# Patient Record
Sex: Male | Born: 1960 | Race: White | Hispanic: No | Marital: Married | Smoking: Former smoker
Health system: Southern US, Community
[De-identification: ages and names within clinical notes are randomized; demographics above are authoritative.]

## PROBLEM LIST (undated history)

## (undated) DIAGNOSIS — E119 Type 2 diabetes mellitus without complications: Secondary | ICD-10-CM

## (undated) DIAGNOSIS — E7800 Pure hypercholesterolemia, unspecified: Secondary | ICD-10-CM

## (undated) DIAGNOSIS — I10 Essential (primary) hypertension: Secondary | ICD-10-CM

## (undated) DIAGNOSIS — G4730 Sleep apnea, unspecified: Secondary | ICD-10-CM

## (undated) HISTORY — PX: APPENDECTOMY: SHX54

## (undated) HISTORY — PX: PALATE / UVULA BIOPSY / EXCISION: SUR128

## (undated) HISTORY — PX: LEG AMPUTATION BELOW KNEE: SHX694

---

## 2016-11-16 ENCOUNTER — Ambulatory Visit: Payer: Self-pay

## 2016-11-30 ENCOUNTER — Telehealth: Payer: Self-pay

## 2016-11-30 NOTE — Telephone Encounter (Signed)
SENT NOTES TO SCHEDULING 

## 2016-12-07 ENCOUNTER — Other Ambulatory Visit: Payer: Self-pay | Admitting: Nephrology

## 2016-12-07 DIAGNOSIS — N183 Chronic kidney disease, stage 3 unspecified: Secondary | ICD-10-CM

## 2016-12-12 ENCOUNTER — Other Ambulatory Visit: Payer: BLUE CROSS/BLUE SHIELD

## 2016-12-24 ENCOUNTER — Emergency Department (HOSPITAL_COMMUNITY)
Admission: EM | Admit: 2016-12-24 | Discharge: 2016-12-24 | Disposition: A | Discharge status: Home / Self Care | Payer: Medicare Other | Attending: Emergency Medicine | Admitting: Emergency Medicine

## 2016-12-24 ENCOUNTER — Encounter (HOSPITAL_COMMUNITY): Payer: Self-pay

## 2016-12-24 DIAGNOSIS — I10 Essential (primary) hypertension: Secondary | ICD-10-CM | POA: Insufficient documentation

## 2016-12-24 DIAGNOSIS — E119 Type 2 diabetes mellitus without complications: Secondary | ICD-10-CM | POA: Diagnosis not present

## 2016-12-24 DIAGNOSIS — Z79899 Other long term (current) drug therapy: Secondary | ICD-10-CM | POA: Insufficient documentation

## 2016-12-24 DIAGNOSIS — L03115 Cellulitis of right lower limb: Secondary | ICD-10-CM | POA: Diagnosis not present

## 2016-12-24 DIAGNOSIS — L538 Other specified erythematous conditions: Secondary | ICD-10-CM | POA: Diagnosis present

## 2016-12-24 HISTORY — DX: Type 2 diabetes mellitus without complications: E11.9

## 2016-12-24 HISTORY — DX: Essential (primary) hypertension: I10

## 2016-12-24 MED ORDER — DOXYCYCLINE HYCLATE 100 MG PO CAPS: 100.0000 mg | ORAL_CAPSULE | Freq: Two times a day (BID) | ORAL | 0 refills | Status: AC

## 2016-12-24 NOTE — Discharge Instructions (Signed)
Please read and follow all provided instructions.  Your diagnoses today include:  1. Cellulitis of right anterior lower leg    Tests performed today include: Vital signs. See below for your results today.   Medications prescribed:  Take as prescribed   Home care instructions:  Follow any educational materials contained in this packet.  Follow-up instructions: Please follow-up with your primary care provider for further evaluation of symptoms and treatment   Return instructions:  Please return to the Emergency Department if you do not get better, if you get worse, or new symptoms OR  - Fever (temperature greater than 101.27F)  - Bleeding that does not stop with holding pressure to the area    -Severe pain (please note that you may be more sore the day after your accident)  - Chest Pain  - Difficulty breathing  - Severe nausea or vomiting  - Inability to tolerate food and liquids  - Passing out  - Skin becoming red around your wounds  - Change in mental status (confusion or lethargy)  - New numbness or weakness    Please return if you have any other emergent concerns.  Additional Information:  Your vital signs today were: BP (!) 146/83    Pulse 61    Temp 98.2 F (36.8 C) (Oral)    Resp 14    SpO2 98%  If your blood pressure (BP) was elevated above 135/85 this visit, please have this repeated by your doctor within one month. ---------------

## 2016-12-24 NOTE — ED Triage Notes (Signed)
Pt states cut to R lower leg 3 days ago. Pt states worsening redness and warmth to area. Pt denies any drainage or bleeding. Pt ambulatory at triage.

## 2016-12-24 NOTE — ED Provider Notes (Signed)
[facility] DEPT Provider Note   CSN: [id number] Arrival date & time: 12/24/16  2129   By signing my name below, I, Eunice Blase, attest that this documentation has been prepared under the direction and in the presence of Shary Decamp, PA-C. Electronically Signed: Eunice Blase, Scribe. 12/24/16. 10:19 PM.   History   Chief Complaint Chief Complaint  Patient presents with  . Leg Injury   The history is provided by the patient, medical records and the spouse. No language interpreter was used.    HPI Comments: Isaiah Mejia is a 55 y.o. male who presents to the Emergency Department complaining of worsening lower anterior RLE pain after sustaining a mild  laceration to the area ~3 days ago. Pt and wife note associated progressive redness and warm to the area that began today. He states he treated the area with alcohol, abx ointment and bandage with adequate relief the first 2 days. Pt notes baseline thin skin on the shin, Hx of left leg amputation and Hx of DM. Pt denies fever and drainage.  Pt also expresses concern for a hangnail on the left little finger.  Past Medical History:  Diagnosis Date  . Diabetes mellitus without complication ([place])   . Hypertension     There are no active problems to display for this patient.   History reviewed. No pertinent surgical history.     Home Medications    Prior to Admission medications   Medication Sig Start Date End Date Taking? Authorizing Provider  doxycycline (VIBRAMYCIN) 100 MG capsule Take 1 capsule (100 mg total) by mouth 2 (two) times daily. 12/24/16 12/31/16  Shary Decamp, PA-C    Family History History reviewed. No pertinent family history.  Social History Social History  Substance Use Topics  . Smoking status: Never Smoker  . Smokeless tobacco: Never Used  . Alcohol use No     Allergies   Sulfa antibiotics   Review of Systems Review of Systems  Cardiovascular: Negative for leg swelling.  Musculoskeletal:  Negative for gait problem.  Skin: Positive for wound.  Allergic/Immunologic: Positive for immunocompromised state.     Physical Exam Updated Vital Signs BP (!) 146/83   Pulse 61   Temp 98.2 F (36.8 C) (Oral)   Resp 14   SpO2 98%   Physical Exam  Constitutional: He is oriented to person, place, and time. He appears well-developed and well-nourished.  HENT:  Head: Normocephalic and atraumatic.  Eyes: Conjunctivae are normal. Pupils are equal, round, and reactive to light.  Neck: Normal range of motion.  Pulmonary/Chest: Effort normal.  Neurological: He is alert and oriented to person, place, and time. Coordination normal.  Skin: Skin is warm and dry. There is erythema.  Wound with mild erythema; no purulence; no red streaking; no fluctuance/ induration palpable  Psychiatric: He has a normal mood and affect. His behavior is normal. Judgment and thought content normal.  Nursing note and vitals reviewed.      ED Treatments / Results  DIAGNOSTIC STUDIES: Oxygen Saturation is 98% on RA, normal by my interpretation.    COORDINATION OF CARE: 10:17 PM Discussed treatment plan with pt at bedside and pt agreed to plan. Will order medication. Pt advised of symptomatic care at home.  Labs (all labs ordered are listed, but only abnormal results are displayed) Labs Reviewed - No data to display  EKG  EKG Interpretation None       Radiology No results found.  Procedures Procedures (including critical care time)  Medications Ordered  in ED Medications - No data to display   Initial Impression / Assessment and Plan / ED Course  I have reviewed the triage vital signs and the nursing notes.  Pertinent labs & imaging results that were available during my care of the patient were reviewed by me and considered in my medical decision making (see chart for details).  Final Clinical Impressions(s) / ED Diagnoses     {I have reviewed the relevant previous healthcare records.  {I  obtained HPI from historian.   ED Course:  Assessment: Pt is a 56 y.o. male with hx DM who presents with right anterior lower leg cellulitis x3 days. No fevers. No purulence. No red streaking. Mild erythema. No induration or fluctuance palpable. On exam, pt in NAD. Nontoxic/nonseptic appearing. VSS. Afebrile. Given Rx Doxy and close follow up to PCP. Strict return precautions.. Plan is to DC home. At time of discharge, Patient is in no acute distress. Vital Signs are stable. Patient is able to ambulate. Patient able to tolerate PO.   Disposition/Plan:  DC Home Additional Verbal discharge instructions given and discussed with patient.  Pt Instructed to f/u with PCP in the next week for evaluation and treatment of symptoms. Return precautions given Pt acknowledges and agrees with plan  Supervising Physician Nat Christen, MD  Final diagnoses:  Cellulitis of right anterior lower leg    New Prescriptions New Prescriptions   DOXYCYCLINE (VIBRAMYCIN) 100 MG CAPSULE    Take 1 capsule (100 mg total) by mouth 2 (two) times daily.   I personally performed the services described in this documentation, which was scribed in my presence. The recorded information has been reviewed and is accurate.    Shary Decamp, PA-C 12/24/16 [place], MD 12/25/16 [phone]

## 2016-12-24 NOTE — ED Notes (Signed)
Pt stable, states understanding of discharge instructions 

## 2017-10-24 ENCOUNTER — Encounter (HOSPITAL_COMMUNITY): Payer: Self-pay | Admitting: Emergency Medicine

## 2017-10-24 ENCOUNTER — Emergency Department (HOSPITAL_COMMUNITY): Payer: Medicare Other

## 2017-10-24 ENCOUNTER — Inpatient Hospital Stay (HOSPITAL_COMMUNITY)
Admission: EM | Admit: 2017-10-24 | Discharge: 2017-10-29 | DRG: 464 | Disposition: A | Discharge status: Home Health | Payer: Medicare Other | Attending: Orthopedic Surgery | Admitting: Orthopedic Surgery

## 2017-10-24 DIAGNOSIS — Z6832 Body mass index (BMI) 32.0-32.9, adult: Secondary | ICD-10-CM

## 2017-10-24 DIAGNOSIS — Z79899 Other long term (current) drug therapy: Secondary | ICD-10-CM

## 2017-10-24 DIAGNOSIS — L03116 Cellulitis of left lower limb: Secondary | ICD-10-CM | POA: Diagnosis present

## 2017-10-24 DIAGNOSIS — E1142 Type 2 diabetes mellitus with diabetic polyneuropathy: Secondary | ICD-10-CM | POA: Diagnosis present

## 2017-10-24 DIAGNOSIS — N179 Acute kidney failure, unspecified: Secondary | ICD-10-CM | POA: Diagnosis present

## 2017-10-24 DIAGNOSIS — Z882 Allergy status to sulfonamides status: Secondary | ICD-10-CM | POA: Diagnosis not present

## 2017-10-24 DIAGNOSIS — G4730 Sleep apnea, unspecified: Secondary | ICD-10-CM | POA: Diagnosis not present

## 2017-10-24 DIAGNOSIS — Z7984 Long term (current) use of oral hypoglycemic drugs: Secondary | ICD-10-CM | POA: Diagnosis not present

## 2017-10-24 DIAGNOSIS — N183 Chronic kidney disease, stage 3 unspecified: Secondary | ICD-10-CM | POA: Diagnosis not present

## 2017-10-24 DIAGNOSIS — Y835 Amputation of limb(s) as the cause of abnormal reaction of the patient, or of later complication, without mention of misadventure at the time of the procedure: Secondary | ICD-10-CM | POA: Diagnosis present

## 2017-10-24 DIAGNOSIS — T8744 Infection of amputation stump, left lower extremity: Secondary | ICD-10-CM | POA: Diagnosis present

## 2017-10-24 DIAGNOSIS — E1169 Type 2 diabetes mellitus with other specified complication: Secondary | ICD-10-CM | POA: Diagnosis present

## 2017-10-24 DIAGNOSIS — E1122 Type 2 diabetes mellitus with diabetic chronic kidney disease: Secondary | ICD-10-CM | POA: Diagnosis present

## 2017-10-24 DIAGNOSIS — E785 Hyperlipidemia, unspecified: Secondary | ICD-10-CM | POA: Diagnosis not present

## 2017-10-24 DIAGNOSIS — L02416 Cutaneous abscess of left lower limb: Secondary | ICD-10-CM | POA: Diagnosis present

## 2017-10-24 DIAGNOSIS — E669 Obesity, unspecified: Secondary | ICD-10-CM | POA: Diagnosis present

## 2017-10-24 DIAGNOSIS — I10 Essential (primary) hypertension: Secondary | ICD-10-CM | POA: Diagnosis not present

## 2017-10-24 DIAGNOSIS — Z7982 Long term (current) use of aspirin: Secondary | ICD-10-CM | POA: Diagnosis not present

## 2017-10-24 DIAGNOSIS — B9561 Methicillin susceptible Staphylococcus aureus infection as the cause of diseases classified elsewhere: Secondary | ICD-10-CM | POA: Diagnosis present

## 2017-10-24 DIAGNOSIS — Z8619 Personal history of other infectious and parasitic diseases: Secondary | ICD-10-CM | POA: Diagnosis not present

## 2017-10-24 DIAGNOSIS — D696 Thrombocytopenia, unspecified: Secondary | ICD-10-CM | POA: Diagnosis not present

## 2017-10-24 DIAGNOSIS — I129 Hypertensive chronic kidney disease with stage 1 through stage 4 chronic kidney disease, or unspecified chronic kidney disease: Secondary | ICD-10-CM | POA: Diagnosis present

## 2017-10-24 DIAGNOSIS — G4733 Obstructive sleep apnea (adult) (pediatric): Secondary | ICD-10-CM | POA: Diagnosis present

## 2017-10-24 DIAGNOSIS — Z89512 Acquired absence of left leg below knee: Secondary | ICD-10-CM | POA: Diagnosis not present

## 2017-10-24 DIAGNOSIS — L0291 Cutaneous abscess, unspecified: Secondary | ICD-10-CM | POA: Diagnosis present

## 2017-10-24 DIAGNOSIS — D62 Acute posthemorrhagic anemia: Secondary | ICD-10-CM | POA: Diagnosis not present

## 2017-10-24 DIAGNOSIS — E7800 Pure hypercholesterolemia, unspecified: Secondary | ICD-10-CM | POA: Diagnosis present

## 2017-10-24 HISTORY — DX: Pure hypercholesterolemia, unspecified: E78.00

## 2017-10-24 HISTORY — DX: Sleep apnea, unspecified: G47.30

## 2017-10-24 LAB — CBC
HCT: 38.4 % — ABNORMAL LOW (ref 39.0–52.0)
HEMOGLOBIN: 13.4 g/dL (ref 13.0–17.0)
MCH: 31.5 pg (ref 26.0–34.0)
MCHC: 34.9 g/dL (ref 30.0–36.0)
MCV: 90.4 fL (ref 78.0–100.0)
PLATELETS: 168 10*3/uL (ref 150–400)
RBC: 4.25 MIL/uL (ref 4.22–5.81)
RDW: 13 % (ref 11.5–15.5)
WBC: 10.6 10*3/uL — AB (ref 4.0–10.5)

## 2017-10-24 LAB — BASIC METABOLIC PANEL
Anion gap: 11 (ref 5–15)
BUN: 33 mg/dL — AB (ref 6–20)
CO2: 21 mmol/L — ABNORMAL LOW (ref 22–32)
Calcium: 9.3 mg/dL (ref 8.9–10.3)
Chloride: 107 mmol/L (ref 101–111)
Creatinine, Ser: 1.79 mg/dL — ABNORMAL HIGH (ref 0.61–1.24)
GFR, EST AFRICAN AMERICAN: 46 mL/min — AB (ref >60)
GFR, EST NON AFRICAN AMERICAN: 40 mL/min — AB (ref >60)
Glucose, Bld: 131 mg/dL — ABNORMAL HIGH (ref 65–99)
Potassium: 4.6 mmol/L (ref 3.5–5.1)
SODIUM: 139 mmol/L (ref 135–145)

## 2017-10-24 LAB — CBG MONITORING, ED: GLUCOSE-CAPILLARY: 161 mg/dL — AB (ref 65–99)

## 2017-10-24 LAB — SEDIMENTATION RATE: Sed Rate: 25 mm/hr — ABNORMAL HIGH (ref 0–16)

## 2017-10-24 LAB — C-REACTIVE PROTEIN: CRP: <0.8 mg/dL (ref <1.0)

## 2017-10-24 LAB — GLUCOSE, CAPILLARY: Glucose-Capillary: 169 mg/dL — ABNORMAL HIGH (ref 65–99)

## 2017-10-24 LAB — I-STAT CG4 LACTIC ACID, ED: LACTIC ACID, VENOUS: 0.7 mmol/L (ref 0.5–1.9)

## 2017-10-24 MED ORDER — ONDANSETRON HCL 4 MG PO TABS: 4.0000 mg | ORAL_TABLET | Freq: Four times a day (QID) | ORAL | Status: DC | PRN

## 2017-10-24 MED ORDER — OXYCODONE HCL 5 MG PO TABS
5.0000 mg | ORAL_TABLET | ORAL | Status: DC | PRN
  Administered 2017-10-24 – 2017-10-25 (×3): 15 mg via ORAL
  Filled 2017-10-24 (×4): qty 3

## 2017-10-24 MED ORDER — ACETAMINOPHEN 650 MG RE SUPP: 650.0000 mg | Freq: Four times a day (QID) | RECTAL | Status: DC | PRN

## 2017-10-24 MED ORDER — VANCOMYCIN HCL 10 G IV SOLR
2000.0000 mg | Freq: Once | INTRAVENOUS | Status: AC
  Administered 2017-10-24: 2000 mg via INTRAVENOUS
  Filled 2017-10-24 (×2): qty 2000

## 2017-10-24 MED ORDER — OXYCODONE-ACETAMINOPHEN 5-325 MG PO TABS
2.0000 | ORAL_TABLET | Freq: Once | ORAL | Status: AC
  Administered 2017-10-24: 2 via ORAL
  Filled 2017-10-24: qty 2

## 2017-10-24 MED ORDER — DOXYCYCLINE HYCLATE 100 MG PO TABS
100.0000 mg | ORAL_TABLET | Freq: Once | ORAL | Status: AC
  Administered 2017-10-24: 100 mg via ORAL
  Filled 2017-10-24: qty 1

## 2017-10-24 MED ORDER — MORPHINE SULFATE (PF) 4 MG/ML IV SOLN
4.0000 mg | Freq: Once | INTRAVENOUS | Status: DC
  Filled 2017-10-24: qty 1

## 2017-10-24 MED ORDER — INSULIN ASPART 100 UNIT/ML ~~LOC~~ SOLN
0.0000 [IU] | Freq: Three times a day (TID) | SUBCUTANEOUS | Status: DC
  Administered 2017-10-25: 2 [IU] via SUBCUTANEOUS
  Administered 2017-10-25: 5 [IU] via SUBCUTANEOUS
  Administered 2017-10-25 – 2017-10-26 (×3): 3 [IU] via SUBCUTANEOUS

## 2017-10-24 MED ORDER — LORAZEPAM 2 MG/ML IJ SOLN
1.0000 mg | Freq: Once | INTRAMUSCULAR | Status: AC
  Administered 2017-10-24: 1 mg via INTRAVENOUS
  Filled 2017-10-24: qty 1

## 2017-10-24 MED ORDER — ACETAMINOPHEN 325 MG PO TABS: 650.0000 mg | ORAL_TABLET | Freq: Four times a day (QID) | ORAL | Status: DC | PRN

## 2017-10-24 MED ORDER — MORPHINE SULFATE (PF) 4 MG/ML IV SOLN
2.0000 mg | INTRAVENOUS | Status: DC | PRN
  Filled 2017-10-24: qty 1

## 2017-10-24 MED ORDER — PIPERACILLIN-TAZOBACTAM 3.375 G IVPB
3.3750 g | Freq: Three times a day (TID) | INTRAVENOUS | Status: DC
  Administered 2017-10-24 – 2017-10-26 (×5): 3.375 g via INTRAVENOUS
  Filled 2017-10-24 (×7): qty 50

## 2017-10-24 MED ORDER — PIPERACILLIN-TAZOBACTAM 3.375 G IVPB 30 MIN
3.3750 g | Freq: Once | INTRAVENOUS | Status: AC
  Administered 2017-10-24: 3.375 g via INTRAVENOUS
  Filled 2017-10-24: qty 50

## 2017-10-24 MED ORDER — ONDANSETRON HCL 4 MG/2ML IJ SOLN
4.0000 mg | Freq: Four times a day (QID) | INTRAMUSCULAR | Status: DC | PRN
  Administered 2017-10-26 (×2): 4 mg via INTRAVENOUS

## 2017-10-24 MED ORDER — GADOBENATE DIMEGLUMINE 529 MG/ML IV SOLN
20.0000 mL | Freq: Once | INTRAVENOUS | Status: AC | PRN
  Administered 2017-10-24: 20 mL via INTRAVENOUS

## 2017-10-24 MED ORDER — VANCOMYCIN HCL 10 G IV SOLR
1250.0000 mg | INTRAVENOUS | Status: DC
  Administered 2017-10-25: 1250 mg via INTRAVENOUS
  Filled 2017-10-24 (×2): qty 1250

## 2017-10-24 MED ORDER — ENOXAPARIN SODIUM 40 MG/0.4ML ~~LOC~~ SOLN
40.0000 mg | SUBCUTANEOUS | Status: DC
  Administered 2017-10-24: 40 mg via SUBCUTANEOUS
  Filled 2017-10-24: qty 0.4

## 2017-10-24 NOTE — ED Provider Notes (Signed)
[facility] Provider Note   CSN: [id number] Arrival date & time: 10/24/17  0530     History   Chief Complaint Chief Complaint  Patient presents with  . Leg Pain    HPI Isaiah Mejia is a 57 y.o. male.  HPI 57 year old male with a history of left below-the-knee amputation 2 years ago in the ER presents the emergency department with complaints of small erythema of the lateral aspect of his distal stump with some new pain drainage which began today.  Concern for possible infection and thus came to the ER for further evaluation.  He has been seen once by the team at [facility].  He denies fevers and chills.  He does report wearing a new prosthesis.  He feels like there may be some irritation on the lateral aspect.  No other complaints.   Past Medical History:  Diagnosis Date  . Diabetes mellitus without complication ([place])   . Hypertension     Patient Active Problem List   Diagnosis Date Noted  . Acute abscess 10/24/2017    Past Surgical History:  Procedure Laterality Date  . APPENDECTOMY    . LEG AMPUTATION BELOW KNEE Left   . PALATE / UVULA BIOPSY / EXCISION         Home Medications    Prior to Admission medications   Medication Sig Start Date End Date Taking? Authorizing Provider  amLODipine (NORVASC) 10 MG tablet Take 10 mg by mouth daily.   Yes [provider]  aspirin 325 MG tablet Take 325 mg by mouth daily.   Yes [provider]  gabapentin (NEURONTIN) 300 MG capsule Take 300 mg by mouth 3 (three) times daily. 10/23/17  Yes [provider]  glipiZIDE (GLUCOTROL XL) 10 MG 24 hr tablet Take 1 tablet by mouth daily. 09/05/17  Yes [provider]  lisinopril (PRINIVIL,ZESTRIL) 10 MG tablet Take 10 mg by mouth daily.   Yes [provider]  Multiple Vitamins-Minerals (MULTIVITAMIN WITH MINERALS) tablet Take 1 tablet by mouth daily.   Yes [provider]  simvastatin (ZOCOR) 40 MG  tablet Take 40 mg by mouth at bedtime. 10/23/17  Yes [provider]  vitamin C (ASCORBIC ACID) 500 MG tablet Take 500 mg by mouth daily.   Yes [provider]    Family History History reviewed. No pertinent family history.  Social History Social History   Tobacco Use  . Smoking status: Never Smoker  . Smokeless tobacco: Never Used  Substance Use Topics  . Alcohol use: No  . Drug use: No     Allergies   Sulfa antibiotics   Review of Systems Review of Systems  All other systems reviewed and are negative.    Physical Exam Updated Vital Signs BP (!) 164/70 (BP Location: Left Arm)   Pulse 83   Temp 100 F (37.8 C) (Oral)   Resp 18   Ht 6\' 2"  (1.88 m)   Wt 110.7 kg (244 lb)   SpO2 96%   BMI 31.33 kg/m   Physical Exam  Constitutional: He is oriented to person, place, and time. He appears well-developed and well-nourished.  HENT:  Head: Normocephalic.  Eyes: EOM are normal.  Neck: Normal range of motion.  Pulmonary/Chest: Effort normal.  Abdominal: He exhibits no distension.  Musculoskeletal: Normal range of motion.  Left BKA with mild erythema at the lateral aspect with serous non-foul smelling drainage from the area.  Some generalized tenderness to this area without gross  fluctuance or significant spreading erythema or warmth.  Neurological: He is alert and oriented to person, place, and time.  Psychiatric: He has a normal mood and affect.  Nursing note and vitals reviewed.    ED Treatments / Results  Labs (all labs ordered are listed, but only abnormal results are displayed) Labs Reviewed  CBC - Abnormal; Notable for the following components:      Result Value   WBC 10.6 (*)    HCT 38.4 (*)    All other components within normal limits  BASIC METABOLIC PANEL - Abnormal; Notable for the following components:   CO2 21 (*)    Glucose, Bld 131 (*)    BUN 33 (*)    Creatinine, Ser 1.79 (*)    GFR calc non Af Amer 40 (*)    GFR calc Af  Amer 46 (*)    All other components within normal limits  SEDIMENTATION RATE - Abnormal; Notable for the following components:   Sed Rate 25 (*)    All other components within normal limits  GLUCOSE, CAPILLARY - Abnormal; Notable for the following components:   Glucose-Capillary 169 (*)    All other components within normal limits  CBG MONITORING, ED - Abnormal; Notable for the following components:   Glucose-Capillary 161 (*)    All other components within normal limits  C-REACTIVE PROTEIN  HIV ANTIBODY (ROUTINE TESTING)  I-STAT CG4 LACTIC ACID, ED    EKG  EKG Interpretation None       Radiology Dg Tibia/fibula Left  Result Date: 10/24/2017 CLINICAL DATA:  Cutaneous wound over the left BKA stump with serous drainage this morning and mild redness and touch tenderness. BKA was performed in 2015. EXAM: LEFT TIBIA AND FIBULA - 2 VIEW COMPARISON:  None in PACs FINDINGS: There is soft tissue swelling over the stump with small amounts of soft tissue gas. There are calcifications adjacent to the tibial stump margin but no objective changes of osteomyelitis. There is irregularity of the tip of the fibular stump which could reflect osteomyelitis. Without previous studies it is difficult to judge if the contour of the fibular tip has changed. There are vascular calcifications. IMPRESSION: Cellulitis of the stump of the left BKA. Possible osteomyelitis of the tip of the fibular remnant. Electronically Signed   By: David  Martinique M.D.   On: 10/24/2017 08:25   Mr Tibia Fibula Left W Wo Contrast  Result Date: 10/24/2017 CLINICAL DATA:  History of left BKA with new drainage from the stump wound. Evaluate for osteomyelitis. EXAM: MRI OF LOWER LEFT EXTREMITY WITHOUT AND WITH CONTRAST TECHNIQUE: Multiplanar, multisequence MR imaging of the left tibia and fibula was performed both before and after administration of intravenous contrast. CONTRAST:  80mL MULTIHANCE GADOBENATE DIMEGLUMINE 529 MG/ML IV SOLN  COMPARISON:  Left tibia and fibula x-rays from same day. FINDINGS: Bones/Joint/Cartilage Prior left below-knee amputation. No marrow signal abnormality. No fracture or dislocation. Normal alignment. Moderate right and small left knee joint effusions. Moderate degenerative changes of the right knee. Mild degenerative changes of the left knee. Moderate right Baker cyst. Ligaments Knee ligaments are grossly intact. Tears of both medial menisci, incompletely evaluated. Muscles and Tendons Mild fatty atrophy of the proximal left lower leg muscles. Soft tissue Prominent soft tissue edema and enhancement along the anterior and lateral aspect of the BKA stump. A few foci of T1 and T2 hypointense signal within the soft tissues likely represents air. There is an ill-defined, irregular, rim enhancing fluid collection in the lateral  subcutaneous tissues of the distal stump which appears to extend to the skin surface. This measures approximately 5.3 x 3.0 x 11.0 cm (AP by transverse by CC). IMPRESSION: 1. Cellulitis of the distal left BKA stump with 5.3 x 3.0 x 11.0 cm ill-defined, irregular, rim enhancing fluid collection in the lateral subcutaneous tissues of the distal stump, concerning for abscess. 2. No evidence of osteomyelitis. 3. Moderate right and small left knee joint effusions. 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 4. Degenerative tearing of both medial menisci, incompletely evaluated. Electronically Signed   By: Titus Dubin M.D.   On: 10/24/2017 14:16    Procedures Procedures (including critical care time)  Medications Ordered in ED Medications  acetaminophen (TYLENOL) tablet 650 mg (not administered)    Or  acetaminophen (TYLENOL) suppository 650 mg (not administered)  enoxaparin (LOVENOX) injection 40 mg (40 mg Subcutaneous Given 10/24/17 2201)  oxyCODONE (Oxy IR/ROXICODONE) immediate release tablet 5-15 mg (15 mg Oral Given 10/24/17 2151)  morphine 4 MG/ML injection 2 mg (not  administered)  ondansetron (ZOFRAN) tablet 4 mg (not administered)    Or  ondansetron (ZOFRAN) injection 4 mg (not administered)  piperacillin-tazobactam (ZOSYN) IVPB 3.375 g (3.375 g Intravenous New Bag/Given 10/24/17 2153)  insulin aspart (novoLOG) injection 0-15 Units (3 Units Subcutaneous Not Given 10/24/17 1823)  vancomycin (VANCOCIN) 1,250 mg in sodium chloride 0.9 % 250 mL IVPB (not administered)  doxycycline (VIBRA-TABS) tablet 100 mg (100 mg Oral Given 10/24/17 0753)  oxyCODONE-acetaminophen (PERCOCET/ROXICET) 5-325 MG per tablet 2 tablet (2 tablets Oral Given 10/24/17 1034)  LORazepam (ATIVAN) injection 1 mg (1 mg Intravenous Given 10/24/17 1229)  gadobenate dimeglumine (MULTIHANCE) injection 20 mL (20 mLs Intravenous Contrast Given 10/24/17 1340)  oxyCODONE-acetaminophen (PERCOCET/ROXICET) 5-325 MG per tablet 2 tablet (2 tablets Oral Given 10/24/17 1438)  piperacillin-tazobactam (ZOSYN) IVPB 3.375 g (0 g Intravenous Stopped 10/24/17 1735)  vancomycin (VANCOCIN) 2,000 mg in sodium chloride 0.9 % 500 mL IVPB (2,000 mg Intravenous New Bag/Given 10/24/17 1824)     Initial Impression / Assessment and Plan / ED Course  I have reviewed the triage vital signs and the nursing notes.  Pertinent labs & imaging results that were available during my care of the patient were reviewed by me and considered in my medical decision making (see chart for details).     I personally evaluate the images.  Questionable osteomyelitis noted on plain films.  Orthopedic consultation and recommendation for MRI which demonstrated a large distal soft tissue density concerning for abscess.  Patient will be admitted for operative drainage of the abscess tomorrow.  IV antibiotics now.  Pain control.  Final Clinical Impressions(s) / ED Diagnoses   Final diagnoses:  None    ED Discharge Orders    None       Jola Schmidt, MD 10/24/17 2313

## 2017-10-24 NOTE — Progress Notes (Signed)
Pharmacy Antibiotic Note  Isaiah Mejia is a 57 y.o. male admitted on 10/24/2017 with left BKA stump infection.  Pharmacy has been consulted for vancomycin dosing. Renal insufficiency noted with sCr 1.79 and CrCl ~ 60 ml/min.  Vancomycin trough goal 10-15  Plan: 1) Vancomycin 2g IV x 1 then 1250mg  IV q24 2) Zosyn 3.375g IV q8 (4 hour infusion) per MD 3) Follow renal function, cultures, LOT, level if needed  Height: 6\' 2"  (188 cm) Weight: 245 lb (111.1 kg) IBW/kg (Calculated) : 82.2  Temp (24hrs), Avg:98.4 F (36.9 C), Min:98.2 F (36.8 C), Max:98.7 F (37.1 C)  Recent Labs  Lab 10/24/17 0547 10/24/17 0603  WBC 10.6*  --   CREATININE 1.79*  --   LATICACIDVEN  --  0.70    Estimated Creatinine Clearance: 59.7 mL/min (A) (by C-G formula based on SCr of 1.79 mg/dL (H)).    Allergies  Allergen Reactions  . Sulfa Antibiotics     Throat swelling    Thank you for allowing pharmacy to be a part of this patient's care.  Deboraha Sprang 10/24/2017 4:04 PM

## 2017-10-24 NOTE — ED Triage Notes (Signed)
Pt reports pain in L bka starting this morning upon waking. Some drainage from a small pinpoint area on base of amputation site.

## 2017-10-24 NOTE — Consult Note (Addendum)
Reason for Consult:Left BKA drainage Referring Physician: K Shahzaib [name] is an 57 y.o. male.  HPI: Isaiah Mejia was awoken this morning by pain in his stump. He was also having some clearish discharge from it. He has had occasional openings before but never any drainage. He denies any fevers, chills, sweats, N/V.   Past Medical History:  Diagnosis Date  . Diabetes mellitus without complication ([place])   . Hypertension     Past Surgical History:  Procedure Laterality Date  . APPENDECTOMY    . LEG AMPUTATION BELOW KNEE Left   . PALATE / UVULA BIOPSY / EXCISION      History reviewed. No pertinent family history.  Social History:  reports that  has never smoked. he has never used smokeless tobacco. He reports that he does not drink alcohol or use drugs.  Allergies:  Allergies  Allergen Reactions  . Sulfa Antibiotics     Throat swelling    Medications: I have reviewed the patient's current medications.  Results for orders placed or performed during the hospital encounter of 10/24/17 (from the past 48 hour(s))  CBC     Status: Abnormal   Collection Time: 10/24/17  5:47 AM  Result Value Ref Range   WBC 10.6 (H) 4.0 - 10.5 K/uL   RBC 4.25 4.22 - 5.81 MIL/uL   Hemoglobin 13.4 13.0 - 17.0 g/dL   HCT 38.4 (L) 39.0 - 52.0 %   MCV 90.4 78.0 - 100.0 fL   MCH 31.5 26.0 - 34.0 pg   MCHC 34.9 30.0 - 36.0 g/dL   RDW 13.0 11.5 - 15.5 %   Platelets 168 150 - 400 K/uL  Basic metabolic panel     Status: Abnormal   Collection Time: 10/24/17  5:47 AM  Result Value Ref Range   Sodium 139 135 - 145 mmol/L   Potassium 4.6 3.5 - 5.1 mmol/L   Chloride 107 101 - 111 mmol/L   CO2 21 (L) 22 - 32 mmol/L   Glucose, Bld 131 (H) 65 - 99 mg/dL   BUN 33 (H) 6 - 20 mg/dL   Creatinine, Ser 1.79 (H) 0.61 - 1.24 mg/dL   Calcium 9.3 8.9 - 10.3 mg/dL   GFR calc non Af Amer 40 (L) >60 mL/min   GFR calc Af Amer 46 (L) >60 mL/min    Comment: (NOTE) The eGFR has been calculated using the CKD EPI  equation. This calculation has not been validated in all clinical situations. eGFR's persistently <60 mL/min signify possible Chronic Kidney Disease.    Anion gap 11 5 - 15  I-Stat CG4 Lactic Acid, ED     Status: None   Collection Time: 10/24/17  6:03 AM  Result Value Ref Range   Lactic Acid, Venous 0.70 0.5 - 1.9 mmol/L    Dg Tibia/fibula Left  Result Date: 10/24/2017 CLINICAL DATA:  Cutaneous wound over the left BKA stump with serous drainage this morning and mild redness and touch tenderness. BKA was performed in 2015. EXAM: LEFT TIBIA AND FIBULA - 2 VIEW COMPARISON:  None in PACs FINDINGS: There is soft tissue swelling over the stump with small amounts of soft tissue gas. There are calcifications adjacent to the tibial stump margin but no objective changes of osteomyelitis. There is irregularity of the tip of the fibular stump which could reflect osteomyelitis. Without previous studies it is difficult to judge if the contour of the fibular tip has changed. There are vascular calcifications. IMPRESSION: Cellulitis of the stump of the  left BKA. Possible osteomyelitis of the tip of the fibular remnant. Electronically Signed   By: David  Martinique M.D.   On: 10/24/2017 08:25    Review of Systems  Constitutional: Negative for weight loss.  HENT: Negative for ear discharge, ear pain, hearing loss and tinnitus.   Eyes: Negative for blurred vision, double vision, photophobia and pain.  Respiratory: Negative for cough, sputum production and shortness of breath.   Cardiovascular: Negative for chest pain.  Gastrointestinal: Negative for abdominal pain, nausea and vomiting.  Genitourinary: Negative for dysuria, flank pain, frequency and urgency.  Musculoskeletal: Positive for joint pain (Left BKA stump). Negative for back pain, falls, myalgias and neck pain.  Neurological: Negative for dizziness, tingling, sensory change, focal weakness, loss of consciousness and headaches.  Endo/Heme/Allergies: Does  not bruise/bleed easily.  Psychiatric/Behavioral: Negative for depression, memory loss and substance abuse. The patient is not nervous/anxious.    Blood pressure (!) 155/84, pulse 63, temperature 98.4 F (36.9 C), temperature source Oral, resp. rate 18, height 6' 2"  (1.88 m), weight 111.1 kg (245 lb), SpO2 96 %. Physical Exam  Constitutional: He appears well-developed and well-nourished. No distress.  HENT:  Head: Normocephalic and atraumatic.  Eyes: Conjunctivae are normal. Right eye exhibits no discharge. Left eye exhibits no discharge. No scleral icterus.  Neck: Normal range of motion.  Cardiovascular: Normal rate and regular rhythm.  Respiratory: Effort normal. No respiratory distress.  Musculoskeletal:  LLE No traumatic wounds, ecchymosis, or rash  Distal BKA stump moderate TTP, small fistula with mild discharge serous fluid, small patch erythema   Neurological: He is alert.  Skin: Skin is warm and dry. He is not diaphoretic.  Psychiatric: He has a normal mood and affect. His behavior is normal.    Assessment/Plan: S/p left BKA w/cellulitis, seroma -- Would recommend course of oral abx and outpatient follow-up with Dr. Doran Durand. Will obtain MRI prior to discharge.    Isaiah Abu, PA-C Orthopedic Surgery [phone] 10/24/2017, 9:54 AM   MRI shows an abscess of the left leg.  No osteo.  We'll admit him for IV abx and surgical debridement.

## 2017-10-24 NOTE — ED Notes (Signed)
ED Provider at bedside. 

## 2017-10-25 ENCOUNTER — Other Ambulatory Visit: Payer: Self-pay

## 2017-10-25 ENCOUNTER — Encounter (HOSPITAL_BASED_OUTPATIENT_CLINIC_OR_DEPARTMENT_OTHER): Payer: Self-pay | Admitting: *Deleted

## 2017-10-25 LAB — GLUCOSE, CAPILLARY
GLUCOSE-CAPILLARY: 220 mg/dL — AB (ref 65–99)
Glucose-Capillary: 295 mg/dL — ABNORMAL HIGH (ref 65–99)
Glucose-Capillary: 157 mg/dL — ABNORMAL HIGH (ref 65–99)
Glucose-Capillary: 133 mg/dL — ABNORMAL HIGH (ref 65–99)
Glucose-Capillary: 209 mg/dL — ABNORMAL HIGH (ref 65–99)

## 2017-10-25 LAB — SURGICAL PCR SCREEN
MRSA, PCR: NEGATIVE
Staphylococcus aureus: POSITIVE — AB

## 2017-10-25 LAB — HIV ANTIBODY (ROUTINE TESTING W REFLEX): HIV SCREEN 4TH GENERATION: NONREACTIVE

## 2017-10-25 MED ORDER — VITAMIN C 500 MG PO TABS
500.0000 mg | ORAL_TABLET | Freq: Every day | ORAL | Status: DC
  Administered 2017-10-25 – 2017-10-29 (×5): 500 mg via ORAL
  Filled 2017-10-25 (×5): qty 1

## 2017-10-25 MED ORDER — GABAPENTIN 300 MG PO CAPS
300.0000 mg | ORAL_CAPSULE | Freq: Three times a day (TID) | ORAL | Status: DC
  Administered 2017-10-25 – 2017-10-29 (×12): 300 mg via ORAL
  Filled 2017-10-25 (×12): qty 1

## 2017-10-25 MED ORDER — SIMVASTATIN 40 MG PO TABS
40.0000 mg | ORAL_TABLET | Freq: Every day | ORAL | Status: DC
  Administered 2017-10-25 – 2017-10-27 (×3): 40 mg via ORAL
  Filled 2017-10-25 (×3): qty 1

## 2017-10-25 MED ORDER — MUPIROCIN 2 % EX OINT
1.0000 "application " | TOPICAL_OINTMENT | Freq: Two times a day (BID) | CUTANEOUS | Status: DC
  Administered 2017-10-25 – 2017-10-28 (×5): 1 via NASAL
  Filled 2017-10-25: qty 22

## 2017-10-25 MED ORDER — AMLODIPINE BESYLATE 10 MG PO TABS
10.0000 mg | ORAL_TABLET | Freq: Every day | ORAL | Status: DC
  Administered 2017-10-25 – 2017-10-29 (×5): 10 mg via ORAL
  Filled 2017-10-25 (×5): qty 1

## 2017-10-25 MED ORDER — KCL IN DEXTROSE-NACL 20-5-0.45 MEQ/L-%-% IV SOLN
INTRAVENOUS | Status: DC
  Administered 2017-10-26: 02:00:00 via INTRAVENOUS
  Filled 2017-10-25 (×2): qty 1000

## 2017-10-25 MED ORDER — CHLORHEXIDINE GLUCONATE 4 % EX LIQD
60.0000 mL | Freq: Once | CUTANEOUS | Status: DC
  Filled 2017-10-25: qty 60

## 2017-10-25 MED ORDER — LISINOPRIL 10 MG PO TABS
10.0000 mg | ORAL_TABLET | Freq: Every day | ORAL | Status: DC
  Administered 2017-10-25 – 2017-10-26 (×2): 10 mg via ORAL
  Filled 2017-10-25 (×2): qty 1

## 2017-10-25 MED ORDER — CHLORHEXIDINE GLUCONATE CLOTH 2 % EX PADS
6.0000 | MEDICATED_PAD | Freq: Every day | CUTANEOUS | Status: DC
  Administered 2017-10-25: 6 via TOPICAL

## 2017-10-25 MED ORDER — GLIPIZIDE ER 10 MG PO TB24
10.0000 mg | ORAL_TABLET | Freq: Every day | ORAL | Status: DC
  Administered 2017-10-25: 10 mg via ORAL
  Filled 2017-10-25 (×2): qty 1

## 2017-10-25 MED ORDER — CHLORHEXIDINE GLUCONATE 4 % EX LIQD
60.0000 mL | Freq: Once | CUTANEOUS | Status: AC
  Administered 2017-10-26: 4 via TOPICAL
  Filled 2017-10-25 (×2): qty 60

## 2017-10-25 MED ORDER — POVIDONE-IODINE 10 % EX SWAB
2.0000 "application " | Freq: Once | CUTANEOUS | Status: AC
  Administered 2017-10-26: 2 via TOPICAL

## 2017-10-25 NOTE — Progress Notes (Signed)
Patient's spouse is at the bedside, brought patient a burger and french fries, and did not want the patient to have insulin. Wanted him to have his glipizide instead and "didn't want him to get addicted that stuff." Wife was not receptive to education. States she would give him the medication herself. Explained that is against hospital policy. Attempted to call PA to discuss medications with patient and spouse, wife refused to speak with PA. Then said, "stop carrying on and just give him the insulin"   Patient was receptive to education, agreeable to sliding scale coverage, and understands rational behind holding oral medication and covering with insulin.

## 2017-10-25 NOTE — Progress Notes (Signed)
Patient ID: Isaiah Mejia, male   DOB: 1961/06/25, 57 y.o.   MRN: [id number]   LOS: 1 day   Subjective: Pain controlled, no new c/o.   Objective: Vital signs in last 24 hours: Temp:  [98.2 F (36.8 C)-100 F (37.8 C)] 99.9 F (37.7 C) (01/23 0900) Pulse Rate:  [61-83] 79 (01/23 0900) Resp:  [16-20] 20 (01/23 0900) BP: (144-177)/(68-86) 151/68 (01/23 0900) SpO2:  [92 %-96 %] 94 % (01/23 0900) Weight:  [110.7 kg (244 lb)] 110.7 kg (244 lb) (01/22 2006) Last BM Date: (PTA)   Laboratory  CBC Recent Labs    10/24/17 0547  WBC 10.6*  HGB 13.4  HCT 38.4*  PLT 168   BMET Recent Labs    10/24/17 0547  NA 139  K 4.6  CL 107  CO2 21*  GLUCOSE 131*  BUN 33*  CREATININE 1.79*  CALCIUM 9.3     Physical Exam General appearance: alert and no distress   Assessment/Plan: LLE stump abscess -- For OR tomorrow with Dr. Doran Durand. NPO after midnight. Continue abx.     Lisette Abu, PA-C Orthopedic Surgery [phone] 10/25/2017

## 2017-10-25 NOTE — H&P (Signed)
Isaiah Mejia is an 57 y.o. male.   Chief Complaint: left leg drainage HPI: the patient is a 57 year old male with past medical history significant for diabetes.  He is status post left below-knee amputation at a remote hospital.  He presented to the emergency room with drainage.  He denies fever, chills, nausea, changes in his appetite.  He is taking no antibiotics.  Blood sugars have been well controlled.  Past Medical History:  Diagnosis Date  . Diabetes mellitus without complication ([place])   . Hypertension     Past Surgical History:  Procedure Laterality Date  . APPENDECTOMY    . LEG AMPUTATION BELOW KNEE Left   . PALATE / UVULA BIOPSY / EXCISION      History reviewed. No pertinent family history. Social History:  reports that  has never smoked. he has never used smokeless tobacco. He reports that he does not drink alcohol or use drugs.  Allergies:  Allergies  Allergen Reactions  . Sulfa Antibiotics     Throat swelling    Medications Prior to Admission  Medication Sig Dispense Refill  . amLODipine (NORVASC) 10 MG tablet Take 10 mg by mouth daily.    Marland Kitchen aspirin 325 MG tablet Take 325 mg by mouth daily.    Marland Kitchen gabapentin (NEURONTIN) 300 MG capsule Take 300 mg by mouth 3 (three) times daily.    Marland Kitchen glipiZIDE (GLUCOTROL XL) 10 MG 24 hr tablet Take 1 tablet by mouth daily.    Marland Kitchen lisinopril (PRINIVIL,ZESTRIL) 10 MG tablet Take 10 mg by mouth daily.    . Multiple Vitamins-Minerals (MULTIVITAMIN WITH MINERALS) tablet Take 1 tablet by mouth daily.    . simvastatin (ZOCOR) 40 MG tablet Take 40 mg by mouth at bedtime.    . vitamin C (ASCORBIC ACID) 500 MG tablet Take 500 mg by mouth daily.      Results for orders placed or performed during the hospital encounter of 10/24/17 (from the past 48 hour(s))  CBC     Status: Abnormal   Collection Time: 10/24/17  5:47 AM  Result Value Ref Range   WBC 10.6 (H) 4.0 - 10.5 K/uL   RBC 4.25 4.22 - 5.81 MIL/uL   Hemoglobin 13.4 13.0 - 17.0 g/dL   HCT 38.4 (L) 39.0 - 52.0 %   MCV 90.4 78.0 - 100.0 fL   MCH 31.5 26.0 - 34.0 pg   MCHC 34.9 30.0 - 36.0 g/dL   RDW 13.0 11.5 - 15.5 %   Platelets 168 150 - 400 K/uL  Basic metabolic panel     Status: Abnormal   Collection Time: 10/24/17  5:47 AM  Result Value Ref Range   Sodium 139 135 - 145 mmol/L   Potassium 4.6 3.5 - 5.1 mmol/L   Chloride 107 101 - 111 mmol/L   CO2 21 (L) 22 - 32 mmol/L   Glucose, Bld 131 (H) 65 - 99 mg/dL   BUN 33 (H) 6 - 20 mg/dL   Creatinine, Ser 1.79 (H) 0.61 - 1.24 mg/dL   Calcium 9.3 8.9 - 10.3 mg/dL   GFR calc non Af Amer 40 (L) >60 mL/min   GFR calc Af Amer 46 (L) >60 mL/min    Comment: (NOTE) The eGFR has been calculated using the CKD EPI equation. This calculation has not been validated in all clinical situations. eGFR's persistently <60 mL/min signify possible Chronic Kidney Disease.    Anion gap 11 5 - 15  I-Stat CG4 Lactic Acid, ED     Status:  None   Collection Time: 10/24/17  6:03 AM  Result Value Ref Range   Lactic Acid, Venous 0.70 0.5 - 1.9 mmol/L  Sedimentation rate     Status: Abnormal   Collection Time: 10/24/17 10:02 AM  Result Value Ref Range   Sed Rate 25 (H) 0 - 16 mm/hr  C-reactive protein     Status: None   Collection Time: 10/24/17 10:02 AM  Result Value Ref Range   CRP <0.8 <1.0 mg/dL  CBG monitoring, ED     Status: Abnormal   Collection Time: 10/24/17  5:06 PM  Result Value Ref Range   Glucose-Capillary 161 (H) 65 - 99 mg/dL   Comment 1 Notify RN    Comment 2 Document in Chart   Glucose, capillary     Status: Abnormal   Collection Time: 10/24/17  8:06 PM  Result Value Ref Range   Glucose-Capillary 295 (H) 65 - 99 mg/dL  Glucose, capillary     Status: Abnormal   Collection Time: 10/24/17  9:43 PM  Result Value Ref Range   Glucose-Capillary 169 (H) 65 - 99 mg/dL  Surgical pcr screen     Status: Abnormal   Collection Time: 10/25/17  6:59 AM  Result Value Ref Range   MRSA, PCR NEGATIVE NEGATIVE   Staphylococcus  aureus POSITIVE (A) NEGATIVE    Comment: (NOTE) The Xpert SA Assay (FDA approved for NASAL specimens in patients 31 years of age and older), is one component of a comprehensive surveillance program. It is not intended to diagnose infection nor to guide or monitor treatment.   Glucose, capillary     Status: Abnormal   Collection Time: 10/25/17  9:00 AM  Result Value Ref Range   Glucose-Capillary 157 (H) 65 - 99 mg/dL  Glucose, capillary     Status: Abnormal   Collection Time: 10/25/17 11:45 AM  Result Value Ref Range   Glucose-Capillary 133 (H) 65 - 99 mg/dL   Dg Tibia/fibula Left  Result Date: 10/24/2017 CLINICAL DATA:  Cutaneous wound over the left BKA stump with serous drainage this morning and mild redness and touch tenderness. BKA was performed in 2015. EXAM: LEFT TIBIA AND FIBULA - 2 VIEW COMPARISON:  None in PACs FINDINGS: There is soft tissue swelling over the stump with small amounts of soft tissue gas. There are calcifications adjacent to the tibial stump margin but no objective changes of osteomyelitis. There is irregularity of the tip of the fibular stump which could reflect osteomyelitis. Without previous studies it is difficult to judge if the contour of the fibular tip has changed. There are vascular calcifications. IMPRESSION: Cellulitis of the stump of the left BKA. Possible osteomyelitis of the tip of the fibular remnant. Electronically Signed   By: David  Martinique M.D.   On: 10/24/2017 08:25   Mr Tibia Fibula Left W Wo Contrast  Result Date: 10/24/2017 CLINICAL DATA:  History of left BKA with new drainage from the stump wound. Evaluate for osteomyelitis. EXAM: MRI OF LOWER LEFT EXTREMITY WITHOUT AND WITH CONTRAST TECHNIQUE: Multiplanar, multisequence MR imaging of the left tibia and fibula was performed both before and after administration of intravenous contrast. CONTRAST:  16m MULTIHANCE GADOBENATE DIMEGLUMINE 529 MG/ML IV SOLN COMPARISON:  Left tibia and fibula x-rays from  same day. FINDINGS: Bones/Joint/Cartilage Prior left below-knee amputation. No marrow signal abnormality. No fracture or dislocation. Normal alignment. Moderate right and small left knee joint effusions. Moderate degenerative changes of the right knee. Mild degenerative changes of the left knee. Moderate  right Baker cyst. Ligaments Knee ligaments are grossly intact. Tears of both medial menisci, incompletely evaluated. Muscles and Tendons Mild fatty atrophy of the proximal left lower leg muscles. Soft tissue Prominent soft tissue edema and enhancement along the anterior and lateral aspect of the BKA stump. A few foci of T1 and T2 hypointense signal within the soft tissues likely represents air. There is an ill-defined, irregular, rim enhancing fluid collection in the lateral subcutaneous tissues of the distal stump which appears to extend to the skin surface. This measures approximately 5.3 x 3.0 x 11.0 cm (AP by transverse by CC). IMPRESSION: 1. Cellulitis of the distal left BKA stump with 5.3 x 3.0 x 11.0 cm ill-defined, irregular, rim enhancing fluid collection in the lateral subcutaneous tissues of the distal stump, concerning for abscess. 2. No evidence of osteomyelitis. 3. Moderate right and small left knee joint effusions. 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 4. Degenerative tearing of both medial menisci, incompletely evaluated. Electronically Signed   By: Titus Dubin M.D.   On: 10/24/2017 14:16    ROSas above  Blood pressure (!) 151/68, pulse 79, temperature 99.9 F (37.7 C), temperature source Oral, resp. rate 20, height _0  (1.88 m), weight 110.7 kg (244 lb), SpO2 94 %. Physical Exam  Well-nourished well-developed male in no apparent distress.  Alert and oriented 4.  Mood and affect are normal.  Extra motions are intact.  Respirations are unlabored.  Left lower extremity stump has mild erythema and a small ulcer laterally.  There is some drainage from this area.  Mild  tenderness to palpation.  Full range of motion at the knee.  5 out of 5 strength in plantar flexion and dorsiflexion of the knee.  Sensibility light touch is intact at the stump.  No lymphadenopathy. Assessment/Plan Left below-knee amputation stump abscess - the patient will be admitted and started on IV antibiotics to continue with the treatment initiated by the emergency room.  Will plan to take him to surgery on Thursday the 24th for I&D of this deep abscess.  He likely will need 6 weeks of IV antibiotics postop.  Wylene Simmer, MD 10/25/2017, 12:27 PM

## 2017-10-25 NOTE — Pre-Procedure Instructions (Signed)
Spoke with [name] on Isaiah Mejia that is taking care of [name]. [facility] will be there at 1245 to pick pt up and bring to [facility]. They will hold diabetic meds and treat with SS insulin. She states that pt is alert and oriented and cooperative.

## 2017-10-25 NOTE — Progress Notes (Addendum)
Subjective:     Patient with PMH significant for diabetic peripheral neuropathy and L BKA in [place] in 2015.  He reports that 2-3 days ago he noticed a small wound on the lateral aspect of his BKA incision site.  He notes the following day he experienced clear drainage without any aroma.  He denies fever, chills, N/V.  The patient then went to the [facility] ED for evaluation.  Radiographs and MRI were obtained.  MRI is concerning for abscess.  The patient was then admitted to the hospital to be scheduled for I&D of abscess and stump revision.  The patient was empirically placed on vanc and zosyn.  The patient reports that he has only seen Dr. Doran Durand one time previously for a Rx for a prosthesis.  The patient notes that his current prosthesis is only a couple months old.  His most recent A1C is in the 5 range.  He is not a smoker.  Objective:   VITALS:  Temp:  [98.2 F (36.8 C)-100 F (37.8 C)] 98.8 F (37.1 C) (01/23 0552) Pulse Rate:  [57-83] 74 (01/23 0552) Resp:  [16-18] 18 (01/23 0552) BP: (144-177)/(69-89) 145/69 (01/23 0552) SpO2:  [92 %-96 %] 93 % (01/23 0552) Weight:  [110.7 kg (244 lb)] 110.7 kg (244 lb) (01/22 2006)  General: WDWN patient in NAD. Psych:  Appropriate mood and affect. Neuro:  A&O x 3, Moving all extremities, sensation intact to light touch HEENT:  EOMs intact= Chest:  Even non-labored respirations Skin:  Small 1 cm pustule at the lateral aspect of the stump, just proximal to the BKA incision site.  I am able to express purulence from the wound.   Extremities: warm/dry, with mild edema, erythema tracing a few centimeters proximally.  No lymphadenopathy. Pulses: Popliteus 2+ MSK:  ROM: TKE, MMT: able to perform quad set    LABS Recent Labs    10/24/17 0547  HGB 13.4  WBC 10.6*  PLT 168   Recent Labs    10/24/17 0547  NA 139  K 4.6  CL 107  CO2 21*  BUN 33*  CREATININE 1.79*  GLUCOSE 131*   No results for input(s): LABPT, INR in the last 72  hours.   Assessment/Plan:    Hx of L BKA Cellulitis and concerns for abscess at lateral stump incision site.  -Patient will need I&D of abscess. -The patient understands the risks of surgery and would like to proceed with surgical intervention.  He specifically understands the risks of bleeding, infection, nerve damage, delayed wound healing, need for additional surgery, further amputation and even death. -Continue NPO status.  Hopeful to OR today, if not then Dr. Doran Durand will try and get him to the OR tomorrow. -NWB L LE -Dressing changes as needed. -Continue ABX   Mechele Claude, PA-C [place] Office:  [phone]

## 2017-10-26 ENCOUNTER — Other Ambulatory Visit: Payer: Self-pay

## 2017-10-26 ENCOUNTER — Encounter (HOSPITAL_COMMUNITY)
Admission: EM | Disposition: A | Discharge status: Home Health | Payer: Self-pay | Source: Home / Self Care | Attending: Orthopedic Surgery

## 2017-10-26 ENCOUNTER — Encounter (HOSPITAL_COMMUNITY): Payer: Self-pay | Admitting: Anesthesiology

## 2017-10-26 ENCOUNTER — Inpatient Hospital Stay (HOSPITAL_BASED_OUTPATIENT_CLINIC_OR_DEPARTMENT_OTHER): Payer: Medicare Other | Admitting: Certified Registered"

## 2017-10-26 ENCOUNTER — Ambulatory Visit (HOSPITAL_BASED_OUTPATIENT_CLINIC_OR_DEPARTMENT_OTHER): Admission: RE | Admit: 2017-10-26 | Payer: Medicare Other | Source: Ambulatory Visit | Admitting: Orthopedic Surgery

## 2017-10-26 DIAGNOSIS — T8744 Infection of amputation stump, left lower extremity: Secondary | ICD-10-CM | POA: Diagnosis not present

## 2017-10-26 DIAGNOSIS — D62 Acute posthemorrhagic anemia: Secondary | ICD-10-CM | POA: Diagnosis not present

## 2017-10-26 DIAGNOSIS — N183 Chronic kidney disease, stage 3 unspecified: Secondary | ICD-10-CM | POA: Diagnosis not present

## 2017-10-26 DIAGNOSIS — E669 Obesity, unspecified: Secondary | ICD-10-CM

## 2017-10-26 DIAGNOSIS — I10 Essential (primary) hypertension: Secondary | ICD-10-CM

## 2017-10-26 DIAGNOSIS — G4730 Sleep apnea, unspecified: Secondary | ICD-10-CM | POA: Diagnosis present

## 2017-10-26 DIAGNOSIS — E1169 Type 2 diabetes mellitus with other specified complication: Secondary | ICD-10-CM | POA: Diagnosis present

## 2017-10-26 DIAGNOSIS — N179 Acute kidney failure, unspecified: Secondary | ICD-10-CM | POA: Diagnosis not present

## 2017-10-26 DIAGNOSIS — L02416 Cutaneous abscess of left lower limb: Secondary | ICD-10-CM | POA: Diagnosis not present

## 2017-10-26 DIAGNOSIS — E7800 Pure hypercholesterolemia, unspecified: Secondary | ICD-10-CM | POA: Diagnosis present

## 2017-10-26 HISTORY — PX: INCISION AND DRAINAGE: SHX5863

## 2017-10-26 LAB — CBC
HCT: 37.4 % — ABNORMAL LOW (ref 39.0–52.0)
Hemoglobin: 12.8 g/dL — ABNORMAL LOW (ref 13.0–17.0)
MCH: 31.7 pg (ref 26.0–34.0)
MCHC: 34.2 g/dL (ref 30.0–36.0)
MCV: 92.6 fL (ref 78.0–100.0)
PLATELETS: 137 10*3/uL — AB (ref 150–400)
RBC: 4.04 MIL/uL — AB (ref 4.22–5.81)
RDW: 13.2 % (ref 11.5–15.5)
WBC: 10.3 10*3/uL (ref 4.0–10.5)

## 2017-10-26 LAB — GLUCOSE, CAPILLARY
GLUCOSE-CAPILLARY: 166 mg/dL — AB (ref 65–99)
GLUCOSE-CAPILLARY: 155 mg/dL — AB (ref 65–99)
GLUCOSE-CAPILLARY: 139 mg/dL — AB (ref 65–99)
Glucose-Capillary: 188 mg/dL — ABNORMAL HIGH (ref 65–99)
Glucose-Capillary: 145 mg/dL — ABNORMAL HIGH (ref 65–99)
Glucose-Capillary: 226 mg/dL — ABNORMAL HIGH (ref 65–99)

## 2017-10-26 LAB — BASIC METABOLIC PANEL
Anion gap: 11 (ref 5–15)
BUN: 37 mg/dL — AB (ref 6–20)
CALCIUM: 8.5 mg/dL — AB (ref 8.9–10.3)
CO2: 19 mmol/L — AB (ref 22–32)
CREATININE: 2.37 mg/dL — AB (ref 0.61–1.24)
Chloride: 106 mmol/L (ref 101–111)
GFR calc Af Amer: 33 mL/min — ABNORMAL LOW (ref >60)
GFR calc non Af Amer: 29 mL/min — ABNORMAL LOW (ref >60)
GLUCOSE: 196 mg/dL — AB (ref 65–99)
Potassium: 4.7 mmol/L (ref 3.5–5.1)
Sodium: 136 mmol/L (ref 135–145)

## 2017-10-26 LAB — C-REACTIVE PROTEIN: CRP: 15.6 mg/dL — AB (ref <1.0)

## 2017-10-26 LAB — SEDIMENTATION RATE: Sed Rate: 45 mm/hr — ABNORMAL HIGH (ref 0–16)

## 2017-10-26 LAB — HEMOGLOBIN A1C
HEMOGLOBIN A1C: 6.1 % — AB (ref 4.8–5.6)
MEAN PLASMA GLUCOSE: 128.37 mg/dL

## 2017-10-26 LAB — CREATININE, SERUM
CREATININE: 2.28 mg/dL — AB (ref 0.61–1.24)
GFR calc Af Amer: 35 mL/min — ABNORMAL LOW (ref >60)
GFR, EST NON AFRICAN AMERICAN: 30 mL/min — AB (ref >60)

## 2017-10-26 SURGERY — INCISION AND DRAINAGE
Anesthesia: General | Site: Leg Lower | Laterality: Left

## 2017-10-26 MED ORDER — SODIUM CHLORIDE 0.9 % IV SOLN
400.0000 mg | Freq: Two times a day (BID) | INTRAVENOUS | Status: DC
  Administered 2017-10-26 – 2017-10-27 (×2): 400 mg via INTRAVENOUS
  Filled 2017-10-26 (×2): qty 400

## 2017-10-26 MED ORDER — LACTATED RINGERS IV SOLN
INTRAVENOUS | Status: DC
  Administered 2017-10-26: 14:00:00 via INTRAVENOUS

## 2017-10-26 MED ORDER — DOCUSATE SODIUM 100 MG PO CAPS
100.0000 mg | ORAL_CAPSULE | Freq: Two times a day (BID) | ORAL | Status: DC
  Administered 2017-10-26 – 2017-10-29 (×6): 100 mg via ORAL
  Filled 2017-10-26 (×6): qty 1

## 2017-10-26 MED ORDER — METOCLOPRAMIDE HCL 5 MG/ML IJ SOLN: 5.0000 mg | Freq: Three times a day (TID) | INTRAMUSCULAR | Status: DC | PRN

## 2017-10-26 MED ORDER — VANCOMYCIN HCL 500 MG IV SOLR
INTRAVENOUS | Status: AC
  Filled 2017-10-26: qty 500

## 2017-10-26 MED ORDER — INSULIN ASPART 100 UNIT/ML ~~LOC~~ SOLN
0.0000 [IU] | Freq: Every day | SUBCUTANEOUS | Status: DC
  Administered 2017-10-26: 2 [IU] via SUBCUTANEOUS
  Administered 2017-10-27 – 2017-10-28 (×2): 3 [IU] via SUBCUTANEOUS

## 2017-10-26 MED ORDER — PROMETHAZINE HCL 25 MG/ML IJ SOLN: 6.2500 mg | INTRAMUSCULAR | Status: DC | PRN

## 2017-10-26 MED ORDER — MIDAZOLAM HCL 2 MG/2ML IJ SOLN
1.0000 mg | INTRAMUSCULAR | Status: DC | PRN
  Administered 2017-10-26: 2 mg via INTRAVENOUS

## 2017-10-26 MED ORDER — METOCLOPRAMIDE HCL 5 MG PO TABS: 5.0000 mg | ORAL_TABLET | Freq: Three times a day (TID) | ORAL | Status: DC | PRN

## 2017-10-26 MED ORDER — OXYCODONE HCL 5 MG PO TABS
5.0000 mg | ORAL_TABLET | ORAL | Status: DC | PRN
  Administered 2017-10-26 – 2017-10-27 (×3): 15 mg via ORAL
  Filled 2017-10-26 (×3): qty 3

## 2017-10-26 MED ORDER — LIDOCAINE HCL (CARDIAC) 20 MG/ML IV SOLN
INTRAVENOUS | Status: DC | PRN
  Administered 2017-10-26: 100 mg via INTRAVENOUS

## 2017-10-26 MED ORDER — SCOPOLAMINE 1 MG/3DAYS TD PT72
1.0000 | MEDICATED_PATCH | Freq: Once | TRANSDERMAL | Status: DC | PRN
  Filled 2017-10-26: qty 1

## 2017-10-26 MED ORDER — FENTANYL CITRATE (PF) 100 MCG/2ML IJ SOLN
50.0000 ug | INTRAMUSCULAR | Status: DC | PRN
  Administered 2017-10-26 (×2): 50 ug via INTRAVENOUS

## 2017-10-26 MED ORDER — FENTANYL CITRATE (PF) 100 MCG/2ML IJ SOLN: 25.0000 ug | INTRAMUSCULAR | Status: DC | PRN

## 2017-10-26 MED ORDER — SODIUM CHLORIDE 0.9 % IV SOLN
INTRAVENOUS | Status: AC
  Administered 2017-10-26 – 2017-10-27 (×2): via INTRAVENOUS

## 2017-10-26 MED ORDER — SENNA 8.6 MG PO TABS
1.0000 | ORAL_TABLET | Freq: Two times a day (BID) | ORAL | Status: DC
  Administered 2017-10-26 – 2017-10-29 (×6): 8.6 mg via ORAL
  Filled 2017-10-26 (×7): qty 1

## 2017-10-26 MED ORDER — ENOXAPARIN SODIUM 30 MG/0.3ML ~~LOC~~ SOLN
30.0000 mg | SUBCUTANEOUS | Status: DC
  Administered 2017-10-27: 30 mg via SUBCUTANEOUS
  Filled 2017-10-26: qty 0.3

## 2017-10-26 MED ORDER — PROPOFOL 10 MG/ML IV BOLUS
INTRAVENOUS | Status: DC | PRN
  Administered 2017-10-26: 150 mg via INTRAVENOUS

## 2017-10-26 MED ORDER — SODIUM CHLORIDE 0.9 % IR SOLN
Status: DC | PRN
  Administered 2017-10-26: 3000 mL

## 2017-10-26 MED ORDER — KCL IN DEXTROSE-NACL 20-5-0.45 MEQ/L-%-% IV SOLN: INTRAVENOUS | Status: DC

## 2017-10-26 MED ORDER — FENTANYL CITRATE (PF) 100 MCG/2ML IJ SOLN
INTRAMUSCULAR | Status: AC
  Filled 2017-10-26: qty 2

## 2017-10-26 MED ORDER — FENTANYL CITRATE (PF) 100 MCG/2ML IJ SOLN
25.0000 ug | INTRAMUSCULAR | Status: DC | PRN
  Administered 2017-10-26: 25 ug via INTRAVENOUS
  Administered 2017-10-26: 50 ug via INTRAVENOUS
  Administered 2017-10-26: 25 ug via INTRAVENOUS

## 2017-10-26 MED ORDER — MIDAZOLAM HCL 2 MG/2ML IJ SOLN
INTRAMUSCULAR | Status: AC
  Filled 2017-10-26: qty 2

## 2017-10-26 MED ORDER — SODIUM CHLORIDE 0.9 % IV SOLN: INTRAVENOUS | Status: DC

## 2017-10-26 MED ORDER — INSULIN ASPART 100 UNIT/ML ~~LOC~~ SOLN
0.0000 [IU] | Freq: Three times a day (TID) | SUBCUTANEOUS | Status: DC
  Administered 2017-10-26: 1 [IU] via SUBCUTANEOUS
  Administered 2017-10-27 (×3): 2 [IU] via SUBCUTANEOUS
  Administered 2017-10-28: 3 [IU] via SUBCUTANEOUS
  Administered 2017-10-28: 2 [IU] via SUBCUTANEOUS
  Administered 2017-10-28 – 2017-10-29 (×2): 3 [IU] via SUBCUTANEOUS

## 2017-10-26 SURGICAL SUPPLY — 69 items
BANDAGE ACE 6X5 VEL STRL LF (GAUZE/BANDAGES/DRESSINGS) ×3 IMPLANT
BANDAGE ESMARK 6X9 LF (GAUZE/BANDAGES/DRESSINGS) IMPLANT
BLADE SURG 15 STRL LF DISP TIS (BLADE) ×1 IMPLANT
BLADE SURG 15 STRL SS (BLADE) ×2
BNDG COHESIVE 3X5 TAN STRL LF (GAUZE/BANDAGES/DRESSINGS) IMPLANT
BNDG COHESIVE 4X5 TAN STRL (GAUZE/BANDAGES/DRESSINGS) IMPLANT
BNDG COHESIVE 6X5 TAN STRL LF (GAUZE/BANDAGES/DRESSINGS) IMPLANT
BNDG ESMARK 4X9 LF (GAUZE/BANDAGES/DRESSINGS) IMPLANT
BNDG ESMARK 6X9 LF (GAUZE/BANDAGES/DRESSINGS)
BNDG GAUZE ELAST 4 BULKY (GAUZE/BANDAGES/DRESSINGS) ×6 IMPLANT
CANISTER SUCT 1200ML W/VALVE (MISCELLANEOUS) ×3 IMPLANT
CHLORAPREP W/TINT 26ML (MISCELLANEOUS) ×3 IMPLANT
COVER BACK TABLE 60X90IN (DRAPES) ×3 IMPLANT
CUFF TOURNIQUET SINGLE 18IN (TOURNIQUET CUFF) IMPLANT
CUFF TOURNIQUET SINGLE 34IN LL (TOURNIQUET CUFF) ×3 IMPLANT
CUFF TOURNIQUET SINGLE 44IN (TOURNIQUET CUFF) IMPLANT
DRAIN CHANNEL 10M FLAT 3/4 FLT (DRAIN) ×3 IMPLANT
DRAPE EXTREMITY T 121X128X90 (DRAPE) ×3 IMPLANT
DRAPE INCISE IOBAN 66X45 STRL (DRAPES) IMPLANT
DRAPE SURG 17X23 STRL (DRAPES) ×3 IMPLANT
DRAPE U-SHAPE 47X51 STRL (DRAPES) ×3 IMPLANT
DRSG MEPITEL 4X7.2 (GAUZE/BANDAGES/DRESSINGS) ×3 IMPLANT
DRSG PAD ABDOMINAL 8X10 ST (GAUZE/BANDAGES/DRESSINGS) ×3 IMPLANT
ELECT REM PT RETURN 9FT ADLT (ELECTROSURGICAL) ×3
ELECTRODE REM PT RTRN 9FT ADLT (ELECTROSURGICAL) ×1 IMPLANT
EVACUATOR SILICONE 100CC (DRAIN) ×3 IMPLANT
GLOVE BIO SURGEON STRL SZ8 (GLOVE) ×3 IMPLANT
GLOVE BIOGEL PI IND STRL 7.0 (GLOVE) ×3 IMPLANT
GLOVE BIOGEL PI IND STRL 8 (GLOVE) ×1 IMPLANT
GLOVE BIOGEL PI INDICATOR 7.0 (GLOVE) ×6
GLOVE BIOGEL PI INDICATOR 8 (GLOVE) ×2
GLOVE ECLIPSE 6.5 STRL STRAW (GLOVE) ×6 IMPLANT
GLOVE ECLIPSE 7.0 STRL STRAW (GLOVE) ×3 IMPLANT
GLOVE ECLIPSE 8.0 STRL XLNG CF (GLOVE) ×3 IMPLANT
GOWN STRL REUS W/ TWL LRG LVL3 (GOWN DISPOSABLE) ×2 IMPLANT
GOWN STRL REUS W/ TWL XL LVL3 (GOWN DISPOSABLE) ×1 IMPLANT
GOWN STRL REUS W/TWL LRG LVL3 (GOWN DISPOSABLE) ×4
GOWN STRL REUS W/TWL XL LVL3 (GOWN DISPOSABLE) ×2
MANIFOLD NEPTUNE II (INSTRUMENTS) ×6 IMPLANT
NEEDLE HYPO 22GX1.5 SAFETY (NEEDLE) IMPLANT
PACK BASIN DAY SURGERY FS (CUSTOM PROCEDURE TRAY) ×3 IMPLANT
PAD CAST 4YDX4 CTTN HI CHSV (CAST SUPPLIES) ×1 IMPLANT
PADDING CAST ABS 4INX4YD NS (CAST SUPPLIES)
PADDING CAST ABS COTTON 4X4 ST (CAST SUPPLIES) IMPLANT
PADDING CAST COTTON 4X4 STRL (CAST SUPPLIES) ×2
PENCIL BUTTON HOLSTER BLD 10FT (ELECTRODE) ×3 IMPLANT
PIN SAFETY STERILE (MISCELLANEOUS) ×3 IMPLANT
SANITIZER HAND PURELL 535ML FO (MISCELLANEOUS) ×3 IMPLANT
SET IRRIG Y TYPE TUR BLADDER L (SET/KITS/TRAYS/PACK) ×3 IMPLANT
SHEET MEDIUM DRAPE 40X70 STRL (DRAPES) ×3 IMPLANT
SPLINT FAST PLASTER 5X30 (CAST SUPPLIES)
SPLINT PLASTER CAST FAST 5X30 (CAST SUPPLIES) IMPLANT
SPONGE LAP 18X18 X RAY DECT (DISPOSABLE) ×3 IMPLANT
STOCKINETTE 6  STRL (DRAPES) ×2
STOCKINETTE 6 STRL (DRAPES) ×1 IMPLANT
SUCTION FRAZIER HANDLE 10FR (MISCELLANEOUS)
SUCTION TUBE FRAZIER 10FR DISP (MISCELLANEOUS) IMPLANT
SUT ETHILON 2 0 FS 18 (SUTURE) ×3 IMPLANT
SUT ETHILON 3 0 PS 1 (SUTURE) IMPLANT
SUT VIC AB 2-0 SH 27 (SUTURE)
SUT VIC AB 2-0 SH 27XBRD (SUTURE) IMPLANT
SYR BULB 3OZ (MISCELLANEOUS) ×3 IMPLANT
SYR CONTROL 10ML LL (SYRINGE) IMPLANT
TOWEL OR 17X24 6PK STRL BLUE (TOWEL DISPOSABLE) ×3 IMPLANT
TRAY DSU PREP LF (CUSTOM PROCEDURE TRAY) IMPLANT
TUBE CONNECTING 20'X1/4 (TUBING) ×1
TUBE CONNECTING 20X1/4 (TUBING) ×2 IMPLANT
UNDERPAD 30X30 (UNDERPADS AND DIAPERS) ×3 IMPLANT
YANKAUER SUCT BULB TIP NO VENT (SUCTIONS) ×3 IMPLANT

## 2017-10-26 NOTE — Anesthesia Preprocedure Evaluation (Addendum)
Anesthesia Evaluation  Patient identified by MRN, date of birth, ID band Patient awake    Reviewed: Allergy & Precautions, NPO status , Patient's Chart, lab work & pertinent test results  Airway Mallampati: II  TM Distance: >3 FB Neck ROM: Full    Dental  (+) Teeth Intact, Dental Advisory Given   Pulmonary neg pulmonary ROS,    Pulmonary exam normal breath sounds clear to auscultation       Cardiovascular hypertension, Pt. on medications Normal cardiovascular exam Rhythm:Regular Rate:Normal     Neuro/Psych negative neurological ROS     GI/Hepatic negative GI ROS, Neg liver ROS,   Endo/Other  diabetes, Type 2Obesity   Renal/GU Renal InsufficiencyRenal disease     Musculoskeletal Left leg abscess status post left below-knee amputation at a remote hospital   Abdominal   Peds  Hematology negative hematology ROS (+)   Anesthesia Other Findings Day of surgery medications reviewed with the patient.  Reproductive/Obstetrics                            Anesthesia Physical Anesthesia Plan  ASA: III  Anesthesia Plan: General   Post-op Pain Management:    Induction: Intravenous  PONV Risk Score and Plan: 2 and Ondansetron, Midazolam and Treatment may vary due to age or medical condition  Airway Management Planned: LMA  Additional Equipment:   Intra-op Plan:   Post-operative Plan: Extubation in OR  Informed Consent: I have reviewed the patients History and Physical, chart, labs and discussed the procedure including the risks, benefits and alternatives for the proposed anesthesia with the patient or authorized representative who has indicated his/her understanding and acceptance.   Dental advisory given  Plan Discussed with: CRNA  Anesthesia Plan Comments: (Risks/benefits of general anesthesia discussed with patient including risk of damage to teeth, lips, gum, and tongue,  nausea/vomiting, allergic reactions to medications, and the possibility of heart attack, stroke and death.  All patient questions answered.  Patient wishes to proceed.)        Anesthesia Quick Evaluation

## 2017-10-26 NOTE — Consult Note (Signed)
[place] for Infectious Disease   Reason for Consult: Left BKA stump abscess    Referring Physician: Dr. Doran Durand   . amLODipine  10 mg Oral Daily  . Chlorhexidine Gluconate Cloth  6 each Topical Daily  . docusate sodium  100 mg Oral BID  . [START ON 10/27/2017] enoxaparin (LOVENOX) injection  30 mg Subcutaneous Q24H  . gabapentin  300 mg Oral TID  . glipiZIDE  10 mg Oral Q2000  . insulin aspart  0-15 Units Subcutaneous TID WC  . mupirocin ointment  1 application Nasal BID  . senna  1 tablet Oral BID  . simvastatin  40 mg Oral QHS  . vitamin C  500 mg Oral Daily    Recommendations: Continue IV teflaro for now. Will follow up surgical cultures. Pending results, will likely transition to an oral agent and recommend 2 weeks of abx. Will follow.   Assessment: 57 y/o M admitted with a left BKA stump abscess s/p surgical I&D 1/24 per ortho.   Antibiotics: 1/22 IV Vanc >> dc'd 1/23 1/22 IV Zosyn >> dc'd 1/23 1/24 IV Teflaro >>   Cultures: 1/24 I&D cultures >> gram positive cocci   HPI: Isaiah Mejia is a 57 y.o. male with a pmhx of HTN, Type II DM, HLD, and left BKA who presented to the ED on 1/22 with small area of erythema on the lateral aspect of his distal stump with some new pain and drainage x 1 day. Patient reports he underwent left BKA two years ago after he developed a "MRSA" infection of his leg. He has diabetes but reports this has been well controlled with oral agents, last A1c 5.7. He received a new prosthetic in October of 2018 and has been going for frequent fittings and readjustments since. On Monday while at the prosthetic office, he noticed a small ulcer at the distal aspect of his stump. On Tuesday, he developed pain, erythema, and drainage and presented to the ED. On arrival, patient was afebrile with T98.6F and hemodynamically stable BP 160/84, HR 66, RR18, and oxygen 99% on RA. Labs with wbc of 10.6, hemoglobin 13.4, and elevated creatinine 1.79 (no priors).  Xray demonstrated cellulitis of left BKA stump and possible osteomyelitis of the tip of the fibular remnant. Follow up MRI revealed a 5 x 11 cm fluid collection concerning for abscess, without evidence of osteomyelitis. He was started on IV vancomycin and zosyn. Patient underwent surgical I&D per ortho on 1/24. Cultures are pending at this time.   Review of Systems: Review of Systems  Constitutional: Negative for chills and fever.  Eyes: Negative for blurred vision.  Respiratory: Negative for cough and shortness of breath.   Cardiovascular: Negative for chest pain.  Gastrointestinal: Negative for abdominal pain.  Skin: Negative for rash.  Neurological: Negative for dizziness.  Psychiatric/Behavioral: Negative for depression.   All other systems reviewed and are negative    Past Medical History:  Diagnosis Date  . Diabetes mellitus without complication ([place])   . High cholesterol   . Hypertension   . Sleep apnea    uses cpap    Social History   Tobacco Use  . Smoking status: Never Smoker  . Smokeless tobacco: Never Used  Substance Use Topics  . Alcohol use: No  . Drug use: No    History reviewed. No pertinent family history.  Allergies  Allergen Reactions  . Sulfa Antibiotics     Throat swelling    Physical Exam:  Vitals:  10/26/17 1445 10/26/17 1500  BP: 107/70 124/81  Pulse: 64 67  Resp: 14 19  Temp:    SpO2: 92% 93%   Constitutional: NAD, appears comfortable HEENT: Atraumatic, normocephalic. PERRL, anicteric sclera.  Neck: Supple, trachea midline.  Cardiovascular: RRR, no murmurs, rubs, or gallops.  Pulmonary/Chest: CTAB, no wheezes, rales, or rhonchi. No chest wall abnormalities.  Abdominal: Soft, non tender, non distended. +BS.  Extremities: Warm and well perfused. Left BKA stump well wrapped.   Neurological: A&Ox3, CN II - XII grossly intact.  Skin: No rashes or erythema  Psychiatric: Normal mood and affect   Lab Results  Component Value Date    WBC 10.6 (H) 10/24/2017   HGB 13.4 10/24/2017   HCT 38.4 (L) 10/24/2017   MCV 90.4 10/24/2017   PLT 168 10/24/2017    Lab Results  Component Value Date   CREATININE 2.37 (H) 10/26/2017   BUN 37 (H) 10/26/2017   NA 136 10/26/2017   K 4.7 10/26/2017   CL 106 10/26/2017   CO2 19 (L) 10/26/2017   No results found for: ALT, AST, GGT, ALKPHOS   Microbiology: Recent Results (from the past 240 hour(s))  Surgical pcr screen     Status: Abnormal   Collection Time: 10/25/17  6:59 AM  Result Value Ref Range Status   MRSA, PCR NEGATIVE NEGATIVE Final   Staphylococcus aureus POSITIVE (A) NEGATIVE Final    Comment: (NOTE) The Xpert SA Assay (FDA approved for NASAL specimens in patients 27 years of age and older), is one component of a comprehensive surveillance program. It is not intended to diagnose infection nor to guide or monitor treatment.     Velna Ochs, MD - [place] for Infectious Disease [facility] [email or web address] pager  [phone] cell 10/26/2017, 3:09 PM

## 2017-10-26 NOTE — Progress Notes (Signed)
[facility] will be providing [place] [facility] services and Home infusion pharmacy services for pt at DC for home IV ABX.  [facility] Infusion Coordinator will provide in hospital teaching with pt and wife to support education for independence with IV ABX at home.  If patient discharges after hours, please call [phone].   Larry Sierras 10/26/2017, 1:19 PM

## 2017-10-26 NOTE — Transfer of Care (Signed)
Immediate Anesthesia Transfer of Care Note  Patient: Isaiah Mejia  Procedure(s) Performed: IRRIGATION AND DRAINAGE/DEBRIDEMENT LEFT BELOW KNEE AMPUTATION STUMP (Left Leg Lower)  Patient Location: PACU  Anesthesia Type:General  Level of Consciousness: awake, sedated and responds to stimulation  Airway & Oxygen Therapy: Patient Spontanous Breathing and Patient connected to face mask oxygen  Post-op Assessment: Report given to RN and Post -op Vital signs reviewed and stable  Post vital signs: Reviewed and stable  Last Vitals:  Vitals:   10/26/17 0931 10/26/17 1303  BP: 140/70 (!) 145/64  Pulse: 74 78  Resp: 20 20  Temp: 37.7 C 37.8 C  SpO2: 92% 95%    Last Pain:  Vitals:   10/26/17 1303  TempSrc: Oral  PainSc: 3       Patients Stated Pain Goal: 1 (19/91/44 4584)  Complications: No apparent anesthesia complications

## 2017-10-26 NOTE — Progress Notes (Signed)
RT NOTE:  Pt brought in home CPAP for use. RT checked machine and wiring and found no defects. Pt is able to machine home machine. RT available if needed.

## 2017-10-26 NOTE — Interval H&P Note (Signed)
History and Physical Interval Note:  10/26/2017 1:42 PM  Isaiah Mejia  has presented today for surgery, with the diagnosis of LEFT LEG ABCESS  The various methods of treatment have been discussed with the patient and family. After consideration of risks, benefits and other options for treatment, the patient has consented to  Procedure(s): IRRIGATION AND DRAINAGE/DEBRIDEMENT LEFT BELOW KNEE AMPUTATION STUMP (Left) as a surgical intervention .  The patient's history has been reviewed, patient examined, no change in status, stable for surgery.  I have reviewed the patient's chart and labs.  Questions were answered to the patient's satisfaction.    The risks and benefits of the alternative treatment options have been discussed in detail.  The patient wishes to proceed with surgery and specifically understands risks of bleeding, infection, nerve damage, blood clots, need for additional surgery, amputation and death.  Wylene Simmer

## 2017-10-26 NOTE — Consult Note (Addendum)
Medical Consultation   [name]  BDZ:[id number]  DOB: 1961/05/20  DOA: 10/24/2017  PCP: Dineen Kid, MD Outpatient Specialists: Nephrology: Dr. Pearson Grippe.   Requesting physician: Dr. Wylene Simmer, Orthopedics  Reason for consultation: Evaluation and management of acute on chronic kidney disease Evaluation and management of type II DM, HTN and other chronic medical conditions.  History of Present Illness: Isaiah Mejia is an 57 y.o. 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  Patient reports being in usual state of health until 10/23/17 when he first noticed "irritation" on his left BKA stump.  He is usually very particular about caring for his left BKA stump and right foot.  He had usual follow-up appointment with his PCP that morning followed by prosthetic team that afternoon who indicated that he should minimize activity/weightbearing on right lower extremity.  It was felt that the new prosthesis may have caused the initial wound.  On 10/24/17 night, patient woke up with pain in the stump.  He then noticed clear drainage dripping on the floor.  This was associated pain and some redness of the distal stump.  Imaging studies including MRI was concerning for abscess.  He was admitted to the hospital for surgical drainage of the abscess.  He is status post I&D on 1/24.  TRH was consulted to assist in the evaluation and management of acute on chronic kidney disease, DM 2, HTN and other chronic medical conditions.  She currently reports that pain is coming back in his left lower extremity following surgery earlier this afternoon.   He denies any other complaints.    Review of Systems:  ROS All other systems reviewed and apart from HPI, all others are negative.  Past Medical History: Past Medical History:  Diagnosis Date  . Diabetes mellitus without complication ([place])   . High cholesterol   . Hypertension   . Sleep apnea    uses cpap    Past Surgical History: Past Surgical History:  Procedure Laterality Date  . APPENDECTOMY    . LEG AMPUTATION BELOW KNEE Left   . PALATE / UVULA BIOPSY / EXCISION       Allergies:   Allergies  Allergen Reactions  . Sulfa Antibiotics     Throat swelling     Social History:  reports that he has quit smoking. he has never used smokeless tobacco. He reports that he does not drink alcohol or use drugs.   Family History: Family History  Problem Relation Age of Onset  . Diabetes Brother   . Heart disease Brother   . Heart disease Other       Physical Exam: Vitals:   10/26/17 1530 10/26/17 1545 10/26/17 1600 10/26/17 1634  BP: (!) 155/77 (!) 155/82 (!) 154/80 (!) 155/70  Pulse: 66 79 69 77  Resp: 17 17 16 18   Temp:    99.3 F (37.4 C)  TempSrc:    Oral  SpO2: 94% 92% 94% 95%  Weight:      Height:        Constitutional: Alert and awake, oriented x3, not in any acute distress.  Pleasant middle-aged male, moderately built and obese lying comfortably propped up in bed. Eyes: PERLA, EOMI, irises appear normal, anicteric sclera,  ENMT: external ears and nose appear normal, hearing normal, Lips appears normal, oropharynx mucosa, tongue, posterior pharynx appear normal.  Evidence of soft palate surgery.  Mucosa with borderline hydration. Neck: neck appears normal, no masses, normal ROM, no thyromegaly, no JVD  CVS: S1-S2 clear, RRR, no murmur rubs or gallops, no LE edema, normal pedal pulses  Respiratory:  clear to auscultation bilaterally, no wheezing, rales or rhonchi. Respiratory effort normal. No accessory muscle use.  Abdomen: soft nontender,  nondistended, normal bowel sounds, no hepatosplenomegaly, no hernias  Musculoskeletal: : Symmetric 5 x 5 power.  Left BKA stump postop dressing clean and dry.  Drain in place with minimal bloodstained fluid in bulb.  RLE without edema. Neuro: Cranial nerves II-XII intact, strength, sensation, reflexes Psych: judgement and insight appear normal, stable mood and affect, mental status Skin: no rashes or lesions or ulcers, no induration or nodules    Data reviewed:  I have personally reviewed following labs and imaging studies Labs:  CBC: Recent Labs  Lab 10/24/17 0547  WBC 10.6*  HGB 13.4  HCT 38.4*  MCV 90.4  PLT 268    Basic Metabolic Panel: Recent Labs  Lab 10/24/17 0547 10/26/17 0728  NA 139 136  K 4.6 4.7  CL 107 106  CO2 21* 19*  GLUCOSE 131* 196*  BUN 33* 37*  CREATININE 1.79* 2.37*  CALCIUM 9.3 8.5*   GFR Estimated Creatinine Clearance: 45 mL/min (A) (by C-G formula based on SCr of 2.37 mg/dL (H)).  CBG: Recent Labs  Lab 10/26/17 0732 10/26/17 1202 10/26/17 1259 10/26/17 1443 10/26/17 1637  GLUCAP 188* 166* 155* 145* 139*   Microbiology Recent Results (from the past 240 hour(s))  Surgical pcr screen     Status: Abnormal   Collection Time: 10/25/17  6:59 AM  Result Value Ref Range Status   MRSA, PCR NEGATIVE NEGATIVE Final   Staphylococcus aureus POSITIVE (A) NEGATIVE Final    Comment: (NOTE) The Xpert SA Assay (FDA approved for NASAL specimens in patients 65 years of age and older), is one component of a comprehensive surveillance program. It is not intended to diagnose infection nor to guide or monitor treatment.        Inpatient Medications:   Scheduled Meds: . amLODipine  10 mg Oral Daily  . Chlorhexidine Gluconate Cloth  6 each Topical Daily  . docusate sodium  100 mg Oral BID  . [START ON 10/27/2017] enoxaparin (LOVENOX) injection  30 mg Subcutaneous Q24H  . gabapentin  300 mg Oral TID  . insulin aspart  0-5 Units Subcutaneous QHS  .  [START ON 10/27/2017] insulin aspart  0-9 Units Subcutaneous TID WC  . mupirocin ointment  1 application Nasal BID  . senna  1 tablet Oral BID  . simvastatin  40 mg Oral QHS  . vitamin C  500 mg Oral Daily   Continuous Infusions: . sodium chloride    . ceFTAROline [facility]) IV       Radiological Exams on Admission: No results found.  Impression/Recommendations Principal Problem:   Acute renal failure superimposed on stage 3 chronic kidney disease (HCC) Active Problems:   Acute abscess   Diabetes mellitus type 2 in obese (HCC)   Hypertension   High cholesterol   Sleep apnea  1. Acute on stage III chronic kidney disease: Baseline creatinine not known.  Patient states that his Nephrologist has  advised him that he has stage III CKD.  Creatinine 1.79 on admission 1/22.  This increased to 2.37 on 1/24.  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 AKI possibly from acute infection, some dehydration and Lisinopril.  He was on IV vancomycin and Zosyn which have now been discontinued. No contrast or NSAIDs during this admission.  Hold lisinopril.  IV fluids.  Follow BMP in a.m.  If creatinine does not improve or worsens, consider consulting patient's primary Nephrology team. 2. Type II DM with peripheral neuropathy and nephropathy: States that his A1c was 5.9 in October 2018.  Due to worsening renal insufficiency, hold oral medications to avoid hypoglycemia.  NovoLog SSI.  Consider adding low-dose Lantus if CBGs consistently higher.  Currently in reasonable range (139-188).  Check A1c. Continue gabapentin. 3. Essential hypertension: Continue amlodipine.  Hold lisinopril. 4. OSA: Continue nightly CPAP. 5. Hyperlipidemia: Continue statins. 6. Obesity/Body mass index is 31.33 kg/m. 7. Left BKA stump abscess, s/p I&D 1/24: No osteomyelitis on MRI.  ID consulted by primary service, Zosyn and vancomycin discontinued and have initiated IV Teflaro.  ID will formally consult 1/25 and await their recommendations  regarding choice and duration of antibiotics.  Due to his renal insufficiency, I have discontinued PICC line order for now. He may need a central line by IR if long term antibiotics are recommended.  Rest of management: Postop wound care, DVT prophylaxis, pain management, weightbearing/activity as per primary service.   Thank you for this consultation.  We will follow the patient along with you.   Time Spent: 60 minutes.  Vernell Leep M.D. [facility] 10/26/2017, 5:37 PM

## 2017-10-26 NOTE — Progress Notes (Signed)
Reviewed chart. 57 yo M with hx of HTN, type II DM being admitted with left BKA stump abscess s/p surgical I&D per ortho. No evidence of osteomyelitis on MRI. ID consulted for abx management. Discussed case with Dr. Linus Salmons. Will discontinue zosyn, change vancomycin to Teflaro given renal insufficiency. Full consult to follow tomorrow.   Velna Ochs, MD - [facility] [email or web address] [phone] pager  [phone] cell 10/26/2017, 3:50 PM

## 2017-10-26 NOTE — Care Management Note (Addendum)
Case Management Note  Patient Details  Name: Isaiah Mejia MRN: [id number] Date of Birth: 01/07/1961  Subjective/Objective:             Admitted for Left below-knee amputation stump abscess   Action/Plan: Prior to admission patient lived at home with spouse. Patient's brother at bedside, permission granted to speak in front of sibling.  PCP is Lennette Bihari [facility]; uses [facility] on [address] in [place], [place].  Home Dme: Cane, knee scooter and CPAP machine.  Discussed recommendations of [facility] for IV antibiotics.  Patient states he has never used an Acupuncturist in [place], selected [place].  Referral called to [facility] with [facility]. Denies inability to afford medications or food.  Wife is able to take patient to medical appointments.  NCM will continue to follow for discharge needs.                Expected Discharge Plan:  [place]  Discharge planning Services  CM Consult  Post Acute Care Choice:  Home Health Choice offered to:  Patient  DME Arranged:    DME Agency:     HH Arranged:  IV Antibiotics HH Agency:  [facility]  Status of Service:  In process, will continue to follow  Additional Comments:  Kristen Cardinal, RN  Nurse Case Manager [phone] 10/26/2017, 12:10 PM

## 2017-10-26 NOTE — Op Note (Signed)
10/26/2017  2:30 PM  PATIENT:  Isaiah Mejia  57 y.o. male  PRE-OPERATIVE DIAGNOSIS:  LEFT LEG ABCESS  POST-OPERATIVE DIAGNOSIS:  LEFT LEG ABCESS  Procedure(s):  Irrigation and excisional debridement of left leg abscess  SURGEON:  Wylene Simmer, MD  ASSISTANT: None  ANESTHESIA:   General  EBL:  minimal   TOURNIQUET:   Total Tourniquet Time Documented: Thigh (Left) - 19 minutes Total: Thigh (Left) - 19 minutes  COMPLICATIONS:  None apparent  DISPOSITION:  Extubated, awake and stable to recovery.  INDICATION FOR PROCEDURE: The patient is a 57 year old male with past medical history significant for diabetes.  He is status post left below-knee amputation at an outside hospital.  He presented to the emergency room with a complaint of spontaneous drainage and pain from his left below-knee amputation stump.  An MRI was obtained showing an abscess of the sub-cutaneous tissues of the lateral left leg.  He presents now for excisional debridement of this abscess.  He understands the risks and benefits of the alternative treatment options and elects surgical treatment.  He specifically understands risks of bleeding, infection, nerve damage, blood clots, need for additional surgery, revision amputation and death.  PROCEDURE IN DETAIL: After preoperative consent was obtained and the correct operative site was identified patient was brought to the operating room and placed supine on the operating table.  General anesthesia was induced preoperative antibiotics were held because the patient was already on therapeutic IV antibiotics.  A surgical timeout was taken.  The left lower extremity was prepped and draped in standard sterile fashion with a tourniquet around the thigh.  The extremity was exsanguinated and the tourniquet was inflated to 250 mmHg.  A small wound was identified at the anterolateral aspect of the distal stump.  An ellipsoid incision was made around this wound and carried down through  the skin and subtendinous tissue to excise the draining tract in its entirety.  Blunt dissection was then carried proximally.  There was serosanguineous fluid identified in the abscess cavity.  Excisional debridement was then performed from the level of the skin circumferentially down through the subcutaneous tissues to the abscess cavity.  This was performed with scissors, rongeur and curette.  Specimen of deep tissue were sent to microbiology for aerobic and anaerobic culture.  The wound was then irrigated with 3 L of normal saline.  Again debridement was performed with the rongeur removing [place] a few additional small fragments of soft tissue.  There was no gross purulence noted.  Rather the fluid appeared slightly murky but was generally serosanguineous.  A #10 flat JP drain was placed in the wound exiting proximal to the abscess cavity.  It was not stitched in place.  It was placed in the deepest portion of the abscess cavity.  The distal incision was closed with horizontal mattress sutures of 2-0 nylon.  Sterile dressings were applied followed by a compression wrap.  The tourniquet was released after application of the dressings at 19 minutes.  The patient was awakened from anesthesia and transported to the recovery room in stable condition.   FOLLOW UP PLAN: The patient will be transferred back to the inpatient ward.  He will continue on broad-spectrum antibiotics pending culture results.  He will likely need IV antibiotics for 6 weeks postop.

## 2017-10-26 NOTE — Anesthesia Procedure Notes (Signed)
Procedure Name: LMA Insertion Date/Time: 10/26/2017 1:46 PM Performed by: Lyndee Leo, CRNA Pre-anesthesia Checklist: Patient identified, Emergency Drugs available, Suction available and Patient being monitored Patient Re-evaluated:Patient Re-evaluated prior to induction Oxygen Delivery Method: Circle system utilized Preoxygenation: Pre-oxygenation with 100% oxygen Induction Type: IV induction Ventilation: Mask ventilation without difficulty LMA: LMA inserted LMA Size: 4.0 Number of attempts: 1 Airway Equipment and Method: Bite block Placement Confirmation: positive ETCO2 Tube secured with: Tape Dental Injury: Teeth and Oropharynx as per pre-operative assessment

## 2017-10-26 NOTE — Anesthesia Postprocedure Evaluation (Signed)
Anesthesia Post Note  Patient: Isaiah Mejia  Procedure(s) Performed: IRRIGATION AND DRAINAGE/DEBRIDEMENT LEFT BELOW KNEE AMPUTATION STUMP (Left Leg Lower)     Patient location during evaluation: PACU Anesthesia Type: General Level of consciousness: awake and alert Pain management: pain level controlled Vital Signs Assessment: post-procedure vital signs reviewed and stable Respiratory status: spontaneous breathing, nonlabored ventilation and respiratory function stable Cardiovascular status: blood pressure returned to baseline and stable Postop Assessment: no apparent nausea or vomiting Anesthetic complications: no    Last Vitals:  Vitals:   10/26/17 1545 10/26/17 1600  BP: (!) 155/82 (!) 154/80  Pulse: 79 69  Resp: 17 16  Temp:    SpO2: 92% 94%    Last Pain:  Vitals:   10/26/17 1600  TempSrc:   PainSc: 4                  Catalina Gravel

## 2017-10-26 NOTE — Progress Notes (Signed)
Orthopedic surgeon/Hewitt called to request medical consultation on a postoperative patient with diabetes, hypertension and acute kidney injury.  At time of request for consultation I changed maintenance IV fluids to normal saline at 100 cc/hr and discontinued patient's ACE inhibitor in the context of acute kidney injury.  Dr. Doran Durand made me aware that he was consulting ID to assist with antibiotic management and I requested he inform them of the acute kidney injury since current antibiotics are Zosyn/vancomycin.  His preadmission Norvasc for hypertension has been continued.  His preadmission glipizide was also continued.  Postoperatively moderate sliding scale coverage had been ordered which I also agreed with and continued.  Of note at time of request for consultation patient was located in a freestanding [facility] and not in our PACU therefore will not be able to be evaluated until he arrives to [facility]M 12.  Erin Hearing, ANP

## 2017-10-27 ENCOUNTER — Encounter (HOSPITAL_BASED_OUTPATIENT_CLINIC_OR_DEPARTMENT_OTHER): Payer: Self-pay | Admitting: Orthopedic Surgery

## 2017-10-27 ENCOUNTER — Other Ambulatory Visit (HOSPITAL_COMMUNITY): Payer: Medicare Other

## 2017-10-27 DIAGNOSIS — E1122 Type 2 diabetes mellitus with diabetic chronic kidney disease: Secondary | ICD-10-CM

## 2017-10-27 DIAGNOSIS — L03116 Cellulitis of left lower limb: Secondary | ICD-10-CM

## 2017-10-27 DIAGNOSIS — L02416 Cutaneous abscess of left lower limb: Secondary | ICD-10-CM

## 2017-10-27 DIAGNOSIS — Z7984 Long term (current) use of oral hypoglycemic drugs: Secondary | ICD-10-CM

## 2017-10-27 DIAGNOSIS — N183 Chronic kidney disease, stage 3 (moderate): Secondary | ICD-10-CM

## 2017-10-27 DIAGNOSIS — I129 Hypertensive chronic kidney disease with stage 1 through stage 4 chronic kidney disease, or unspecified chronic kidney disease: Secondary | ICD-10-CM

## 2017-10-27 DIAGNOSIS — Z89512 Acquired absence of left leg below knee: Secondary | ICD-10-CM

## 2017-10-27 DIAGNOSIS — Z8619 Personal history of other infectious and parasitic diseases: Secondary | ICD-10-CM

## 2017-10-27 DIAGNOSIS — Z882 Allergy status to sulfonamides status: Secondary | ICD-10-CM

## 2017-10-27 DIAGNOSIS — E785 Hyperlipidemia, unspecified: Secondary | ICD-10-CM

## 2017-10-27 LAB — VANCOMYCIN, RANDOM: Vancomycin Rm: 7

## 2017-10-27 LAB — CBC
HEMATOCRIT: 34.8 % — AB (ref 39.0–52.0)
Hemoglobin: 11.7 g/dL — ABNORMAL LOW (ref 13.0–17.0)
MCH: 31 pg (ref 26.0–34.0)
MCHC: 33.6 g/dL (ref 30.0–36.0)
MCV: 92.1 fL (ref 78.0–100.0)
PLATELETS: 144 10*3/uL — AB (ref 150–400)
RBC: 3.78 MIL/uL — ABNORMAL LOW (ref 4.22–5.81)
RDW: 13.3 % (ref 11.5–15.5)
WBC: 8.9 10*3/uL (ref 4.0–10.5)

## 2017-10-27 LAB — URINALYSIS, ROUTINE W REFLEX MICROSCOPIC
Bilirubin Urine: NEGATIVE
Glucose, UA: 50 mg/dL — AB
Hgb urine dipstick: NEGATIVE
KETONES UR: NEGATIVE mg/dL
Leukocytes, UA: NEGATIVE
Nitrite: NEGATIVE
PH: 5 (ref 5.0–8.0)
Protein, ur: 100 mg/dL — AB
SPECIFIC GRAVITY, URINE: 1.015 (ref 1.005–1.030)
SQUAMOUS EPITHELIAL / LPF: NONE SEEN

## 2017-10-27 LAB — BASIC METABOLIC PANEL
Anion gap: 12 (ref 5–15)
BUN: 34 mg/dL — AB (ref 6–20)
CALCIUM: 8.1 mg/dL — AB (ref 8.9–10.3)
CO2: 21 mmol/L — ABNORMAL LOW (ref 22–32)
Chloride: 105 mmol/L (ref 101–111)
Creatinine, Ser: 2.38 mg/dL — ABNORMAL HIGH (ref 0.61–1.24)
GFR calc Af Amer: 33 mL/min — ABNORMAL LOW (ref >60)
GFR, EST NON AFRICAN AMERICAN: 28 mL/min — AB (ref >60)
Glucose, Bld: 206 mg/dL — ABNORMAL HIGH (ref 65–99)
POTASSIUM: 4.6 mmol/L (ref 3.5–5.1)
SODIUM: 138 mmol/L (ref 135–145)

## 2017-10-27 LAB — GLUCOSE, CAPILLARY
GLUCOSE-CAPILLARY: 198 mg/dL — AB (ref 65–99)
GLUCOSE-CAPILLARY: 194 mg/dL — AB (ref 65–99)
GLUCOSE-CAPILLARY: 162 mg/dL — AB (ref 65–99)
Glucose-Capillary: 252 mg/dL — ABNORMAL HIGH (ref 65–99)

## 2017-10-27 LAB — CK: CK TOTAL: 131 U/L (ref 49–397)

## 2017-10-27 MED ORDER — DOCUSATE SODIUM 100 MG PO CAPS: 100.0000 mg | ORAL_CAPSULE | Freq: Two times a day (BID) | ORAL | 0 refills | Status: AC

## 2017-10-27 MED ORDER — ENOXAPARIN SODIUM 40 MG/0.4ML ~~LOC~~ SOLN
40.0000 mg | SUBCUTANEOUS | Status: DC
  Administered 2017-10-28 – 2017-10-29 (×2): 40 mg via SUBCUTANEOUS
  Filled 2017-10-27 (×2): qty 0.4

## 2017-10-27 MED ORDER — TRAMADOL HCL 50 MG PO TABS: 50.0000 mg | ORAL_TABLET | Freq: Four times a day (QID) | ORAL | 0 refills | Status: AC | PRN

## 2017-10-27 MED ORDER — SENNA 8.6 MG PO TABS: 2.0000 | ORAL_TABLET | Freq: Two times a day (BID) | ORAL | 0 refills | Status: AC

## 2017-10-27 MED ORDER — LINEZOLID 600 MG PO TABS
600.0000 mg | ORAL_TABLET | Freq: Two times a day (BID) | ORAL | Status: DC
Start: 2017-10-27 — End: 2017-10-28
  Administered 2017-10-27 (×2): 600 mg via ORAL
  Filled 2017-10-27 (×3): qty 1

## 2017-10-27 MED ORDER — SODIUM CHLORIDE 0.9 % IV SOLN
INTRAVENOUS | Status: DC
  Administered 2017-10-27 (×2): via INTRAVENOUS

## 2017-10-27 NOTE — Progress Notes (Signed)
Pt wear home CPAP unit.

## 2017-10-27 NOTE — Progress Notes (Signed)
Subjective: 1 Day Post-Op Procedure(s) (LRB): IRRIGATION AND DRAINAGE/DEBRIDEMENT LEFT BELOW KNEE AMPUTATION STUMP (Left)  Patient reports pain as mild.  Tolerating POs well.  Admits to flatus.  Denies fever, chills, N/V, CP, SOB.  Objective:   VITALS:  Temp:  [98.1 F (36.7 C)-100 F (37.8 C)] 98.1 F (36.7 C) (01/25 0604) Pulse Rate:  [62-84] 73 (01/25 0604) Resp:  [14-20] 16 (01/25 0604) BP: (88-155)/(57-82) 122/71 (01/25 0604) SpO2:  [90 %-98 %] 98 % (01/25 0604) Weight:  [110.7 kg (244 lb)] 110.7 kg (244 lb) (01/24 1303)  General: WDWN patient in NAD. Psych:  Appropriate mood and affect. Neuro:  A&O x 3, Moving all extremities, sensation intact to light touch HEENT:  EOMs intact Chest:  Even non-labored respirations Skin:  Incision C/D/I, JP drain intact, with small amount of serosanguinous fluid. Extremities: warm/dry, mild edema, no erythema or echymosis.  No lymphadenopathy. Pulses: opliteus 2+ MSK:  ROM: TKE, MMT: able to perform quad set    LABS Recent Labs    10/26/17 2002  HGB 12.8*  WBC 10.3  PLT 137*   Recent Labs    10/26/17 0728 10/26/17 2002  NA 136  --   K 4.7  --   CL 106  --   CO2 19*  --   BUN 37*  --   CREATININE 2.37* 2.28*  GLUCOSE 196*  --    No results for input(s): LABPT, INR in the last 72 hours.   Assessment/Plan: 1 Day Post-Op Procedure(s) (LRB): IRRIGATION AND DRAINAGE/DEBRIDEMENT LEFT BELOW KNEE AMPUTATION STUMP (Left)  Dressing reapplied.  JP Drain left in place Dressing changes prn NWB L LE Intra op cultures pending ABX per ID recommendation Monitor SCr for improving renal function. Plan for 2 week outpatient post-op visit with Dr. Doran Durand D/C when cultures are final and D/C ABX are finalized per ID.  Mechele Claude, PA-C [facility] Office:  [phone]

## 2017-10-27 NOTE — Progress Notes (Signed)
BENEFIT CHECK:  Per CMA  Memory Argue         # 4.  S/W  JOE @ [facility] # [id number]    1. LINEZOLID 600 MG BID ( ONLY 30 DAY SUPPLY )  COVER- YES  CO-PAY- $ 95.00  TIER- 4 DRUG  PRIOR APPROVAL- YES # [phone]    2.CEPHALEXIN ( KEFLEX )  500 MG 4 TIMES A DAY  COVER- YES  CO-PAY- $ 8.00  TIER- 2 DRUG  PRIOR APPROVAL- NO   3. DOXYCYCLINE 100 MG BID CAPSUL  COVER- YES  CO-PAY - $ $ 45.00  TIER- 3 DRUG  PRIOR APPROVAL- NO

## 2017-10-27 NOTE — Progress Notes (Signed)
Culture growing Staph aureus.  Will be able to tailor based on sesntivities.  I suspect Doxycycline will be adequate.  Should have sensitivities tomorrow and if tetracycline sensitive, 2 weeks of just doxycycline 100 mg twice a day.  Keflex alone if MSSA.   Dr. Tommy Medal will monitor over the weekend.   Thayer Headings, MD

## 2017-10-27 NOTE — Discharge Summary (Signed)
Physician Discharge Summary  Patient ID: Isaiah Mejia MRN: [id number] DOB/AGE: November 17, 1960 57 y.o.  Admit date: 10/24/2017 Discharge date: 10/29/2017  Admission Diagnoses: acute abscess of L lower extremity; DM type II; HTN; high cholesterol; sleep apnea; acute renal failure superimposed on stage 3 chronic kidney disease;  Discharge Diagnoses:  Principal Problem:   Acute renal failure superimposed on stage 3 chronic kidney disease (HCC) Active Problems:   Acute abscess   Diabetes mellitus type 2 in obese (HCC)   Hypertension   High cholesterol   Sleep apnea same as above  Discharged Condition: stable  Hospital Course: Patient is a 57 year old male, with PMH significant of L BKA in 2015, CKD, and type II diabetes, that presented to [facility] ED on 10/24/17 with concerns of new 2 day old wound and drainage at stump the site.  An MRI was obtained that demonstrated evidence of an acute abscess.  Dr. Wylene Simmer was then consulted.  The patient was admitted to the hospital and placed on zosyn and vanc empirically.  Those were later discontinued in favor of Teflaro given the patient's history or renal insufficiency.  The patient was taken to the OR for I&D of abscess and intra-op cultures by Dr. Wylene Simmer.  The patient tolerated the procedure well.  Labs demonstrated that the patient experienced acute renal failure on top of his CKD.  Hospitalists service and ID were consulted.  Lisinopril was held and the patient was given IV fluids.  SCr continually improved during his stay.  Intra-op cultures grew MSSA.  Patient was D/C'd home on 10/29/17 on keflex 500mg  TID x 2 weeks per ID recommendation.  The patient tolerated his stay well.  Consults: ID and hospitalist service.  Significant Diagnostic Studies: labs: BMET, microbiology: wound culture: positive for MSSA and radiology: MRI: for diagnosis.  Treatments: IV hydration, antibiotics: vancomycin, Zosyn, teflaro and keflex, analgesia:  acetaminophen and Vicodin, cardiac meds: lisinopril (Zestril) and amlodipine, insulin: Humalog and surgery: as stated above.  Discharge Exam: Blood pressure 122/71, pulse 73, temperature 98.1 F (36.7 C), temperature source Oral, resp. rate 16, height 6\' 2"  (1.88 m), weight 110.7 kg (244 lb), SpO2 98 %. General: WDWN patient in NAD. Psych:  Appropriate mood and affect. Neuro:  A&O x 3, Moving all extremities, sensation intact to light touch HEENT:  EOMs intact Chest:  Even non-labored respirations Skin: Dressing C/D/I, no rashes or lesions Extremities: warm/dry, mild edema, no erythema or echymosis.  No lymphadenopathy. Pulses: Popliteus 2+ MSK:  ROM: TKE, MMT: able to perform quad set   Disposition: 01-Home or Self Care   Allergies as of 10/27/2017      Reactions   Sulfa Antibiotics    Throat swelling      Medication List    TAKE these medications   amLODipine 10 MG tablet Commonly known as:  NORVASC Take 10 mg by mouth daily.   aspirin 325 MG tablet Take 325 mg by mouth daily.   docusate sodium 100 MG capsule Commonly known as:  COLACE Take 1 capsule (100 mg total) by mouth 2 (two) times daily. While taking narcotic pain medicine.   gabapentin 300 MG capsule Commonly known as:  NEURONTIN Take 300 mg by mouth 3 (three) times daily.   glipiZIDE 10 MG 24 hr tablet Commonly known as:  GLUCOTROL XL Take 1 tablet by mouth daily.   lisinopril 10 MG tablet Commonly known as:  PRINIVIL,ZESTRIL Take 10 mg by mouth daily.   multivitamin with minerals tablet Take  1 tablet by mouth daily.   senna 8.6 MG Tabs tablet Commonly known as:  SENOKOT Take 2 tablets (17.2 mg total) by mouth 2 (two) times daily.   simvastatin 40 MG tablet Commonly known as:  ZOCOR Take 40 mg by mouth at bedtime.   traMADol 50 MG tablet Commonly known as:  ULTRAM Take 1 tablet (50 mg total) by mouth every 6 (six) hours as needed for moderate pain.   vitamin C 500 MG tablet Commonly known  as:  ASCORBIC ACID Take 500 mg by mouth daily.            Durable Medical Equipment  (From admission, onward)        Start     Ordered   10/26/17 1105  For home use only DME standard manual wheelchair with seat cushion  Once    Comments:  Patient suffers from [employer] with infection which impairs their ability to perform daily activities like bathing, dressing and toileting in the home.  A walker will not resolve  issue with performing activities of daily living. A wheelchair will allow patient to safely perform daily activities. Patient can safely propel the wheelchair in the home or has a caregiver who can provide assistance.  Accessories: elevating leg rests (ELRs), wheel locks, extensions and anti-tippers.   10/26/17 1106     Follow-up Information    Wylene Simmer, MD. Schedule an appointment as soon as possible for a visit in 2 week(s).   Specialty:  Orthopedic Surgery Contact information: [address] [place] [address] [phone]           Signed: Mirza Fessel, [place] Office:  [phone]

## 2017-10-27 NOTE — Progress Notes (Signed)
PROGRESS NOTE   Isaiah Mejia  XLK:[id number]    DOB: 1960-12-13    DOA: 10/24/2017  PCP: Dineen Kid, MD   I have briefly reviewed patients previous medical records in [facility]  Brief Narrative:  57 y.o. 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  Status post I&D of left BKA stump abscess on 1/24.  TRH consulted for acute on chronic kidney disease, DM and HTN management.  ID on board.   Assessment & Plan:   Principal Problem:   Acute renal failure superimposed on stage 3 chronic kidney disease (HCC) Active Problems:   Acute abscess   Diabetes mellitus type 2 in obese (HCC)   Hypertension   High cholesterol   Sleep apnea   Abscess of left lower extremity    1. Acute on stage III chronic kidney disease:  Last known creatinine as per discussion with Nephrology: 1.5 in February 2018 and patient missed repeat labs and follow-up in May.  Creatinine 1.79 on admission 1/22.  This increased to 2.37 on 1/24.  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 AKI possibly from acute infection, some dehydration and Lisinopril.  He was on IV vancomycin and Zosyn which have now been discontinued. No contrast or NSAIDs during this admission.  Hold lisinopril.  Good urine output.  Creatinine has plateaued at 2.38.  Discussed with Nephrologist on call who recommends continuing IV fluids, check urine microscopy and hopefully creatinine starts to improve.  Checking renal ultrasound to rule out obstruction although seems less likely. 2. Type II DM with peripheral neuropathy and nephropathy: A1c 6.1.  Due to worsening renal insufficiency, hold  oral medications to avoid hypoglycemia.  NovoLog SSI.  Consider adding low-dose Lantus if CBGs consistently higher.  Continue gabapentin.  CBGs in the 190s. 3. Essential hypertension: Continue amlodipine.  Hold lisinopril.  Controlled. 4. OSA: Continue nightly CPAP. 5. Hyperlipidemia: Continue statins. 6. Obesity/Body mass index is 31.33 kg/m. 7. Left BKA stump abscess, s/p I&D 1/24: No osteomyelitis on MRI.   ID input appreciated and recommend linezolid times 2 weeks versus Keflex + doxycycline times 2 weeks if insurance does not cover linezolid.  Follow cultures. 8. Acute blood loss anemia: EBL minimal.  Some of it may also be dilutional.  Hemoglobin dropped from 13.4 preop to 11.7.  Follow CBCs in a.m. and transfuse if hemoglobin <7. 9. Thrombocytopenia, mild: Possibly due to acute blood loss.  Improving.  Follow CBC in a.m.   DVT prophylaxis: Lovenox Code Status: Full Family Communication: None at bedside Disposition: DC home pending improvement in acute kidney injury.   Consultants:  TRH are consultants Infectious disease  Procedures:  I&D of left BKA stump abscess by primary service on 1/24  Antimicrobials:  IV Teflaro 1/24 >   Subjective: Left lower extremity postop pain controlled.  Slept well overnight.  Denies any complaints.  No dyspnea.  Urinating well.  States that he wants to go home by Sunday.  ROS: As above  Objective:  Vitals:   10/26/17 1634 10/26/17 2057 10/27/17 0604 10/27/17 0804  BP: (!) 155/70 (!) 146/70 122/71 (!) 141/67  Pulse: 77 84 73 73  Resp: 18 17 16  18  Temp: 99.3 F (37.4 C) 98.1 F (36.7 C) 98.1 F (36.7 C) 98.4 F (36.9 C)  TempSrc: Oral Oral Oral Oral  SpO2: 95% 98% 98% 98%  Weight:      Height:        Examination:  General exam: Pleasant middle-aged male lying comfortably supine in bed. Respiratory system: Clear to auscultation. Respiratory effort normal. Cardiovascular system: S1 & S2 heard, RRR. No JVD, murmurs, rubs,  gallops or clicks. No pedal edema. Gastrointestinal system: Abdomen is nondistended, soft and nontender. No organomegaly or masses felt. Normal bowel sounds heard. Central nervous system: Alert and oriented. No focal neurological deficits. Extremities: Symmetric 5 x 5 power.  Left BKA stump dressing clean and dry and has drain with minimal blood stain drainage. Skin: No rashes, lesions or ulcers Psychiatry: Judgement and insight appear normal. Mood & affect appropriate.     Data Reviewed: I have personally reviewed following labs and imaging studies  CBC: Recent Labs  Lab 10/24/17 0547 10/26/17 2002 10/27/17 0836  WBC 10.6* 10.3 8.9  HGB 13.4 12.8* 11.7*  HCT 38.4* 37.4* 34.8*  MCV 90.4 92.6 92.1  PLT 168 137* 161*   Basic Metabolic Panel: Recent Labs  Lab 10/24/17 0547 10/26/17 0728 10/26/17 2002 10/27/17 0836  NA 139 136  --  138  K 4.6 4.7  --  4.6  CL 107 106  --  105  CO2 21* 19*  --  21*  GLUCOSE 131* 196*  --  206*  BUN 33* 37*  --  34*  CREATININE 1.79* 2.37* 2.28* 2.38*  CALCIUM 9.3 8.5*  --  8.1*   HbA1C: Recent Labs    10/26/17 2002  HGBA1C 6.1*   CBG: Recent Labs  Lab 10/26/17 1443 10/26/17 1637 10/26/17 2056 10/27/17 0802 10/27/17 1147  GLUCAP 145* 139* 226* 198* 194*    Recent Results (from the past 240 hour(s))  Surgical pcr screen     Status: Abnormal   Collection Time: 10/25/17  6:59 AM  Result Value Ref Range Status   MRSA, PCR NEGATIVE NEGATIVE Final   Staphylococcus aureus POSITIVE (A) NEGATIVE Final    Comment: (NOTE) The Xpert SA Assay (FDA approved for NASAL specimens in patients 49 years of age and older), is one component of a comprehensive surveillance program. It is not intended to diagnose infection nor to guide or monitor treatment.   Aerobic/Anaerobic Culture (surgical/deep wound)     Status: None (Preliminary result)   Collection Time: 10/26/17  2:06 PM  Result Value Ref Range Status   Specimen Description ABSCESS   Final   Special Requests LEFT LEG  Final   Gram Stain   Final    MODERATE WBC PRESENT,BOTH PMN AND MONONUCLEAR RARE GRAM POSITIVE COCCI    Culture CULTURE REINCUBATED FOR BETTER GROWTH  Final   Report Status PENDING  Incomplete         Radiology Studies: No results found.      Scheduled Meds: . amLODipine  10 mg Oral Daily  . Chlorhexidine Gluconate Cloth  6 each Topical Daily  . docusate sodium  100 mg Oral BID  . enoxaparin (LOVENOX) injection  30 mg Subcutaneous Q24H  . gabapentin  300 mg Oral TID  . insulin aspart  0-5 Units Subcutaneous QHS  . insulin aspart  0-9 Units Subcutaneous TID WC  . mupirocin ointment  1 application Nasal BID  . senna  1 tablet Oral BID  . simvastatin  40 mg Oral QHS  . vitamin  C  500 mg Oral Daily   Continuous Infusions: . sodium chloride    . ceFTAROline (TEFLARO) IV Stopped (10/27/17 1011)     LOS: 3 days     Vernell Leep, MD, FACP, [facility]. [facility] Pager [phone]  If 7PM-7AM, please contact night-coverage [email or web address] Password [facility] 10/27/2017, 12:24 PM

## 2017-10-27 NOTE — Care Management Important Message (Signed)
Important Message  Patient Details  Name: Isaiah Mejia MRN: [id number] Date of Birth: 06-05-1961   Medicare Important Message Given:  Yes    Jemya Depierro P Vernita Tague 10/27/2017, 2:34 PM

## 2017-10-27 NOTE — Discharge Instructions (Addendum)
Call your surgeon if you experience:   1.  Fever over 101.0. 2.  Inability to urinate. 3.  Nausea and/or vomiting. 4.  Extreme swelling or bruising at the surgical site. 5.  Continued bleeding from the incision. 6.  Increased pain, redness or drainage from the incision. 7.  Problems related to your pain medication. 8.  Any problems and/or concerns   Post Anesthesia Home Care Instructions  Activity: Get plenty of rest for the remainder of the day. A responsible individual must stay with you for 24 hours following the procedure.  For the next 24 hours, DO NOT: -Drive a car -Paediatric nurse -Drink alcoholic beverages -Take any medication unless instructed by your physician -Make any legal decisions or sign important papers.  Meals: Start with liquid foods such as gelatin or soup. Progress to regular foods as tolerated. Avoid greasy, spicy, heavy foods. If nausea and/or vomiting occur, drink only clear liquids until the nausea and/or vomiting subsides. Call your physician if vomiting continues.  Special Instructions/Symptoms: Your throat may feel dry or sore from the anesthesia or the breathing tube placed in your throat during surgery. If this causes discomfort, gargle with warm salt water. The discomfort should disappear within 24 hours.  If you had a scopolamine patch placed behind your ear for the management of post- operative nausea and/or vomiting:  1. The medication in the patch is effective for 72 hours, after which it should be removed.  Wrap patch in a tissue and discard in the trash. Wash hands thoroughly with soap and water. 2. You may remove the patch earlier than 72 hours if you experience unpleasant side effects which may include dry mouth, dizziness or visual disturbances. 3. Avoid touching the patch. Wash your hands with soap and water after contact with the patch.    Wylene Simmer, MD [place]  Please read the following information regarding your  care after surgery.  Medications  You only need a prescription for the narcotic pain medicine (ex. oxycodone, Percocet, Norco).  All of the other medicines listed below are available over the counter. X acetominophen (Tylenol) 650 mg every 4-6 hours as you need for minor to moderate pain X tramadol as prescribed for severe pain  Narcotic pain medicine (ex. oxycodone, Percocet, Vicodin) will cause constipation.  To prevent this problem, take the following medicines while you are taking any pain medicine. X docusate sodium (Colace) 100 mg twice a day X senna (Senokot) 2 tablets twice a day  X To help prevent blood clots, continue Aspirin 325mg  daily after surgery.  You should also get up every hour while you are awake to move around.    Weight Bearing X Do not bear any weight on the operated leg or foot.  Cast / Splint / Dressing X Keep your splint, cast or dressing clean and dry.  Dont put anything (coat hanger, pencil, etc) down inside of it.  If it gets damp, use a hair dryer on the cool setting to dry it.  If it gets soaked, call the office to schedule an appointment for a cast change.  After your dressing, cast or splint is removed; you may shower, but do not soak or scrub the wound.  Allow the water to run over it, and then gently pat it dry.  Swelling It is normal for you to have swelling where you had surgery.  To reduce swelling and pain, keep your toes above your nose for at least 3 days after surgery.  It may  be necessary to keep your foot or leg elevated for several weeks.  If it hurts, it should be elevated.  Follow Up Call my office at [phone] when you are discharged from the hospital or surgery center to schedule an appointment to be seen two weeks after surgery.  Call my office at [phone] if you develop a fever >101.5 F, nausea, vomiting, bleeding from the surgical site or severe pain.       Additional discharge instructions:  Please get your medications  reviewed and adjusted by your Primary MD.  Please request your Primary MD to go over all Hospital Tests and Procedure/Radiological results at the follow up, please get all Hospital records sent to your Prim MD by signing hospital release before you go home.  If you had Pneumonia of Lung problems at the Hospital: Please get a 2 view Chest X ray done in 6-8 weeks after hospital discharge or sooner if instructed by your Primary MD.  If you have Congestive Heart Failure: Please call your Cardiologist or Primary MD anytime you have any of the following symptoms:  1) 3 pound weight gain in 24 hours or 5 pounds in 1 week  2) shortness of breath, with or without a dry hacking cough  3) swelling in the hands, feet or stomach  4) if you have to sleep on extra pillows at night in order to breathe  Follow cardiac low salt diet and 1.5 lit/day fluid restriction.  If you have diabetes Accuchecks 4 times/day, Once in AM empty stomach and then before each meal. Log in all results and show them to your primary doctor at your next visit. If any glucose reading is under 80 or above 300 call your primary MD immediately.  If you have Seizure/Convulsions/Epilepsy: Please do not drive, operate heavy machinery, participate in activities at heights or participate in high speed sports until you have seen by Primary MD or a Neurologist and advised to do so again.  If you had Gastrointestinal Bleeding: Please ask your Primary MD to check a complete blood count within one week of discharge or at your next visit. Your endoscopic/colonoscopic biopsies that are pending at the time of discharge, will also need to followed by your Primary MD.  Get Medicines reviewed and adjusted. Please take all your medications with you for your next visit with your Primary MD  Please request your Primary MD to go over all hospital tests and procedure/radiological results at the follow up, please ask your Primary MD to get all Hospital  records sent to his/her office.  If you experience worsening of your admission symptoms, develop shortness of breath, life threatening emergency, suicidal or homicidal thoughts you must seek medical attention immediately by calling 911 or calling your MD immediately  if symptoms less severe.  You must read complete instructions/literature along with all the possible adverse reactions/side effects for all the Medicines you take and that have been prescribed to you. Take any new Medicines after you have completely understood and accpet all the possible adverse reactions/side effects.   Do not drive or operate heavy machinery when taking Pain medications.   Do not take more than prescribed Pain, Sleep and Anxiety Medications  Special Instructions: If you have smoked or chewed Tobacco  in the last 2 yrs please stop smoking, stop any regular Alcohol  and or any Recreational drug use.  Wear Seat belts while driving.  Please note You were cared for by a hospitalist during your hospital stay. If you  have any questions about your discharge medications or the care you received while you were in the hospital after you are discharged, you can call the unit and asked to speak with the hospitalist on call if the hospitalist that took care of you is not available. Once you are discharged, your primary care physician will handle any further medical issues. Please note that NO REFILLS for any discharge medications will be authorized once you are discharged, as it is imperative that you return to your primary care physician (or establish a relationship with a primary care physician if you do not have one) for your aftercare needs so that they can reassess your need for medications and monitor your lab values.  You can reach the hospitalist office at phone [phone] or fax [phone]   If you do not have a primary care physician, you can call [phone] for a physician referral.

## 2017-10-27 NOTE — Progress Notes (Signed)
BENEFIT CHECK:  IV Teflaro is Non-Formulary.  Per plan even if non-formulary medication is approved is usually more costly for the patient.

## 2017-10-28 ENCOUNTER — Inpatient Hospital Stay (HOSPITAL_COMMUNITY): Payer: Medicare Other

## 2017-10-28 LAB — BASIC METABOLIC PANEL
Anion gap: 10 (ref 5–15)
BUN: 32 mg/dL — AB (ref 6–20)
CALCIUM: 8.5 mg/dL — AB (ref 8.9–10.3)
CO2: 19 mmol/L — AB (ref 22–32)
CREATININE: 2.22 mg/dL — AB (ref 0.61–1.24)
Chloride: 107 mmol/L (ref 101–111)
GFR calc Af Amer: 36 mL/min — ABNORMAL LOW (ref >60)
GFR calc non Af Amer: 31 mL/min — ABNORMAL LOW (ref >60)
GLUCOSE: 195 mg/dL — AB (ref 65–99)
Potassium: 5.1 mmol/L (ref 3.5–5.1)
Sodium: 136 mmol/L (ref 135–145)

## 2017-10-28 LAB — CBC
HCT: 34.3 % — ABNORMAL LOW (ref 39.0–52.0)
HEMOGLOBIN: 11.4 g/dL — AB (ref 13.0–17.0)
MCH: 30.6 pg (ref 26.0–34.0)
MCHC: 33.2 g/dL (ref 30.0–36.0)
MCV: 92 fL (ref 78.0–100.0)
Platelets: 149 10*3/uL — ABNORMAL LOW (ref 150–400)
RBC: 3.73 MIL/uL — ABNORMAL LOW (ref 4.22–5.81)
RDW: 12.8 % (ref 11.5–15.5)
WBC: 8.1 10*3/uL (ref 4.0–10.5)

## 2017-10-28 LAB — GLUCOSE, CAPILLARY
GLUCOSE-CAPILLARY: 172 mg/dL — AB (ref 65–99)
Glucose-Capillary: 222 mg/dL — ABNORMAL HIGH (ref 65–99)
Glucose-Capillary: 266 mg/dL — ABNORMAL HIGH (ref 65–99)
Glucose-Capillary: 240 mg/dL — ABNORMAL HIGH (ref 65–99)

## 2017-10-28 MED ORDER — CEPHALEXIN 500 MG PO CAPS
500.0000 mg | ORAL_CAPSULE | Freq: Three times a day (TID) | ORAL | Status: DC
  Administered 2017-10-28 – 2017-10-29 (×3): 500 mg via ORAL
  Filled 2017-10-28 (×4): qty 1

## 2017-10-28 MED ORDER — CEPHALEXIN 500 MG PO CAPS: 500.0000 mg | ORAL_CAPSULE | Freq: Three times a day (TID) | ORAL | 0 refills | Status: AC

## 2017-10-28 MED ORDER — ATORVASTATIN CALCIUM 20 MG PO TABS
20.0000 mg | ORAL_TABLET | Freq: Every day | ORAL | Status: DC
Start: 2017-10-28 — End: 2017-10-29
  Administered 2017-10-28: 20 mg via ORAL
  Filled 2017-10-28: qty 1

## 2017-10-28 MED ORDER — ATORVASTATIN CALCIUM 20 MG PO TABS: 20.0000 mg | ORAL_TABLET | Freq: Every day | ORAL | 0 refills | Status: AC

## 2017-10-28 NOTE — Progress Notes (Signed)
Patient places himself on and off CPAP via home unit

## 2017-10-28 NOTE — Progress Notes (Signed)
Subjective: 2 Days Post-Op Procedure(s) (LRB): IRRIGATION AND DRAINAGE/DEBRIDEMENT LEFT BELOW KNEE AMPUTATION STUMP (Left) Patient reports pain as mild. No other c/o. Hoping to go home tomorrow.   Objective: Vital signs in last 24 hours: Temp:  [98.1 F (36.7 C)-99 F (37.2 C)] 98.4 F (36.9 C) (01/26 0825) Pulse Rate:  [66-78] 70 (01/26 0825) Resp:  [16-18] 16 (01/26 0825) BP: (140-151)/(67-80) 149/80 (01/26 0825) SpO2:  [91 %-94 %] 92 % (01/26 0825)  Intake/Output from previous day: 01/25 0701 - 01/26 0700 In: 1700 [P.O.:200; I.V.:1500] Out: 1900 [Urine:1900] Intake/Output this shift: No intake/output data recorded.  Recent Labs    10/26/17 2002 10/27/17 0836 10/28/17 0524  HGB 12.8* 11.7* 11.4*   Recent Labs    10/27/17 0836 10/28/17 0524  WBC 8.9 8.1  RBC 3.78* 3.73*  HCT 34.8* 34.3*  PLT 144* 149*   Recent Labs    10/27/17 0836 10/28/17 0524  NA 138 136  K 4.6 5.1  CL 105 107  CO2 21* 19*  BUN 34* 32*  CREATININE 2.38* 2.22*  GLUCOSE 206* 195*  CALCIUM 8.1* 8.5*   No results for input(s): LABPT, INR in the last 72 hours.  Neurologically intact ABD soft Neurovascular intact Sensation intact distally Intact pulses distally Incision: dressing C/D/I and no drainage No cellulitis present Compartment soft no sign of DVT  Assessment/Plan: 2 Days Post-Op Procedure(s) (LRB): IRRIGATION AND DRAINAGE/DEBRIDEMENT LEFT BELOW KNEE AMPUTATION STUMP (Left) Advance diet Up with therapy  Awaiting sensitivities today to determine abx coverage per ID JP drain to come out tomorrow Will need final cx back prior to D/C as well as abx for D/C per ID Appreciate ID input  Sindy Mccune M. 10/28/2017, 8:44 AM

## 2017-10-28 NOTE — Progress Notes (Signed)
      INFECTIOUS DISEASE ATTENDING ADDENDUM:   Date: 10/28/2017  Patient name: Isaiah Mejia  Medical record number: [id number]  Date of birth: 04-Jan-1961    Patient growing MSSA on culture  Narrow to keflex. I wrote for 500mg  po TID and would give at least 2 weeks of postoperative antibiotics  We will arrange HSFU in our clinic  Please call for questions.   Rhina Brackett Dam 10/28/2017, 10:30 AM

## 2017-10-28 NOTE — Progress Notes (Addendum)
PROGRESS NOTE   Isaiah Mejia  GXQ:[id number]    DOB: Sep 03, 1961    DOA: 10/24/2017  PCP: Dineen Kid, MD   I have briefly reviewed patients previous medical records in [facility].  Brief Narrative:  57 y.o. 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  Status post I&D of left BKA stump abscess on 1/24.  TRH consulted for acute on chronic kidney disease, DM and HTN management.  ID on board.  Patient is stable for discharge from medical standpoint.  TRH will sign off at this time.  I will review BMP tomorrow.   Assessment & Plan:   Principal Problem:   Acute renal failure superimposed on stage 3 chronic kidney disease (HCC) Active Problems:   Acute abscess   Diabetes mellitus type 2 in obese (HCC)   Hypertension   High cholesterol   Sleep apnea   Abscess of left lower extremity    1. Acute on stage III chronic kidney disease:  Last known creatinine as per discussion with Nephrology on 1/25: 1.5 in February 2018 and patient missed repeat labs and follow-up in May.  Creatinine 1.79 on admission 1/22.  This increased to 2.37 on 1/24.  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 AKI possibly from acute infection, some dehydration and Lisinopril.  He was on IV vancomycin (random vancomycin level 7) and Zosyn which have now been discontinued. No contrast or NSAIDs during this admission. Good urine output. Creatinine has improved to 2.2.  Urine microscopy without acute findings.  Renal ultrasound without hydronephrosis.  Appears euvolemic.  DC IV fluids, continue to hold lisinopril at discharge until outpatient follow-up with  PCP/Nephrology with repeat labs.   2. Type II DM with peripheral neuropathy and nephropathy: A1c 6.1.  Due to worsening renal insufficiency, held oral medications to avoid hypoglycemia.  NovoLog SSI.  Continue gabapentin.  Continue Glucotrol at discharge. 3. Essential hypertension: Continue amlodipine.  Hold lisinopril at discharge until outpatient follow-up with PCP/Nephrology.  Controlled. 4. OSA: Continue nightly CPAP. 5. Hyperlipidemia:  Due to risk of drug interaction between Amlodipine and Simvastatin, changed statin to Atorvastatin 20 mg daily and continue at discharge. 6. Obesity/Body mass index is 31.33 kg/m. 7. MSSA left BKA stump abscess, s/p I&D 1/24: No osteomyelitis on MRI.   ID input appreciated and recommend 2 weeks of oral Keflex (given prescription).  ID will arrange outpatient follow-up. 8. Acute blood loss anemia: EBL minimal.  Some of it may also be dilutional.  Hemoglobin dropped from 13.4 preop to 11.7.  Stable. 9. Thrombocytopenia, mild: Possibly due to acute blood loss.  Gradually improving.   DVT prophylaxis: Lovenox Code Status: Full Family Communication: None at bedside Disposition: DC home at the discretion of primary service.   Consultants:  TRH are consultants Infectious disease  Procedures:  I&D of left BKA stump abscess by primary service on 1/24  Antimicrobials:  IV Teflaro 1/24 > discontinued Oral Keflex.   Subjective: Denies complaints.  ROS: As above  Objective:  Vitals:   10/27/17 2212 10/28/17 0432 10/28/17 0825 10/28/17 1009  BP: 140/67 (!) 151/75 (!) 149/80 (!) 125/52  Pulse: 70 66 70 75  Resp: 16 16 16  16  Temp: 99 F (37.2 C) 98.1 F (36.7 C) 98.4 F (36.9 C) 98.2 F (36.8 C)  TempSrc: Oral Oral Oral Oral  SpO2: 92% 91% 92% 100%  Weight:      Height:        Examination:  General exam: Pleasant middle-aged male lying comfortably supine in bed.  Oral mucosa moist. Respiratory system: Clear to auscultation. Respiratory  effort normal. Cardiovascular system: S1 & S2 heard, RRR. No JVD, murmurs, rubs, gallops or clicks. No pedal edema. Gastrointestinal system: Abdomen is nondistended, soft and nontender. No organomegaly or masses felt. Normal bowel sounds heard. Central nervous system: Alert and oriented. No focal neurological deficits. Extremities: Symmetric 5 x 5 power.  Left BKA stump dressing clean and dry and has drain with minimal blood stain drainage. Skin: No rashes, lesions or ulcers Psychiatry: Judgement and insight appear normal. Mood & affect appropriate.     Data Reviewed: I have personally reviewed following labs and imaging studies  CBC: Recent Labs  Lab 10/24/17 0547 10/26/17 2002 10/27/17 0836 10/28/17 0524  WBC 10.6* 10.3 8.9 8.1  HGB 13.4 12.8* 11.7* 11.4*  HCT 38.4* 37.4* 34.8* 34.3*  MCV 90.4 92.6 92.1 92.0  PLT 168 137* 144* 338*   Basic Metabolic Panel: Recent Labs  Lab 10/24/17 0547 10/26/17 0728 10/26/17 2002 10/27/17 0836 10/28/17 0524  NA 139 136  --  138 136  K 4.6 4.7  --  4.6 5.1  CL 107 106  --  105 107  CO2 21* 19*  --  21* 19*  GLUCOSE 131* 196*  --  206* 195*  BUN 33* 37*  --  34* 32*  CREATININE 1.79* 2.37* 2.28* 2.38* 2.22*  CALCIUM 9.3 8.5*  --  8.1* 8.5*   HbA1C: Recent Labs    10/26/17 2002  HGBA1C 6.1*   CBG: Recent Labs  Lab 10/27/17 1147 10/27/17 1736 10/27/17 2139 10/28/17 0757 10/28/17 1209  GLUCAP 194* 162* 252* 172* 222*    Recent Results (from the past 240 hour(s))  Surgical pcr screen     Status: Abnormal   Collection Time: 10/25/17  6:59 AM  Result Value Ref Range Status   MRSA, PCR NEGATIVE NEGATIVE Final   Staphylococcus aureus POSITIVE (A) NEGATIVE Final    Comment: (NOTE) The Xpert SA Assay (FDA approved for NASAL specimens in patients 19 years of age and older), is one component of a comprehensive surveillance program. It is not intended to diagnose infection nor to guide or monitor treatment.     Aerobic/Anaerobic Culture (surgical/deep wound)     Status: None (Preliminary result)   Collection Time: 10/26/17  2:06 PM  Result Value Ref Range Status   Specimen Description ABSCESS  Final   Special Requests LEFT LEG  Final   Gram Stain   Final    MODERATE WBC PRESENT,BOTH PMN AND MONONUCLEAR RARE GRAM POSITIVE COCCI    Culture   Final    RARE STAPHYLOCOCCUS AUREUS NO ANAEROBES ISOLATED; CULTURE IN PROGRESS FOR 5 DAYS    Report Status PENDING  Incomplete   Organism ID, Bacteria STAPHYLOCOCCUS AUREUS  Final      Susceptibility   Staphylococcus aureus - MIC*    CIPROFLOXACIN <=0.5 SENSITIVE Sensitive     ERYTHROMYCIN >=8 RESISTANT Resistant     GENTAMICIN <=0.5 SENSITIVE Sensitive     OXACILLIN <=0.25 SENSITIVE Sensitive     TETRACYCLINE <=1 SENSITIVE Sensitive     VANCOMYCIN 1 SENSITIVE Sensitive     TRIMETH/SULFA <=10 SENSITIVE  Sensitive     CLINDAMYCIN RESISTANT Resistant     RIFAMPIN <=0.5 SENSITIVE Sensitive     Inducible Clindamycin POSITIVE Resistant     * RARE STAPHYLOCOCCUS AUREUS         Radiology Studies: US Renal  Result Date: 10/28/2017 CLINICAL DATA:  Acute renal injury EXAM: RENAL / URINARY TRACT ULTRASOUND COMPLETE COMPARISON:  None. FINDINGS: Right Kidney: Length: 12.9 cm. There is a tiny amount of perinephric fluid. No masses, stones, or hydronephrosis. Left Kidney: Length: 10.8 cm. The lower pole is not seen due to shadowing bowel gas. No abnormalities identified. Bladder: Appears normal for degree of bladder distention. IMPRESSION: 1. There is a tiny amount of right perinephric fluid which is nonspecific. No hydronephrosis. Poor visualization of the lower pole of the left kidney. Electronically Signed   By: Dorise Bullion III M.D   On: 10/28/2017 10:26        Scheduled Meds: . amLODipine  10 mg Oral Daily  . cephALEXin  500 mg Oral Q8H  . Chlorhexidine Gluconate Cloth  6 each Topical Daily  . docusate sodium  100 mg Oral BID  . enoxaparin  (LOVENOX) injection  40 mg Subcutaneous Q24H  . gabapentin  300 mg Oral TID  . insulin aspart  0-5 Units Subcutaneous QHS  . insulin aspart  0-9 Units Subcutaneous TID WC  . mupirocin ointment  1 application Nasal BID  . senna  1 tablet Oral BID  . simvastatin  40 mg Oral QHS  . vitamin C  500 mg Oral Daily   Continuous Infusions:    LOS: 4 days     Vernell Leep, MD, FACP, [facility]. [facility] Pager [phone]  If 7PM-7AM, please contact night-coverage [email or web address] Password [email or web address] 10/28/2017, 4:17 PM

## 2017-10-29 LAB — BASIC METABOLIC PANEL
Anion gap: 8 (ref 5–15)
BUN: 37 mg/dL — AB (ref 6–20)
CALCIUM: 8.5 mg/dL — AB (ref 8.9–10.3)
CO2: 22 mmol/L (ref 22–32)
CREATININE: 2.12 mg/dL — AB (ref 0.61–1.24)
Chloride: 108 mmol/L (ref 101–111)
GFR calc Af Amer: 38 mL/min — ABNORMAL LOW (ref >60)
GFR calc non Af Amer: 33 mL/min — ABNORMAL LOW (ref >60)
GLUCOSE: 229 mg/dL — AB (ref 65–99)
Potassium: 4.8 mmol/L (ref 3.5–5.1)
Sodium: 138 mmol/L (ref 135–145)

## 2017-10-29 LAB — GLUCOSE, CAPILLARY: Glucose-Capillary: 219 mg/dL — ABNORMAL HIGH (ref 65–99)

## 2017-10-29 MED ORDER — OXYCODONE HCL 5 MG PO TABS
5.0000 mg | ORAL_TABLET | ORAL | 0 refills | Status: AC | PRN
Start: 2017-10-29 — End: ?

## 2017-10-29 NOTE — Progress Notes (Signed)
Chart review  Creatinine: 2.12. Improving. Nothing further to add.  Vernell Leep, MD, FACP, [facility]. [facility] Pager [phone]  If 7PM-7AM, please contact night-coverage [email or web address] Password [facility] 10/29/2017, 7:23 AM

## 2017-10-29 NOTE — Progress Notes (Signed)
Patient discharged to home with spouse. All discharge instructions reviewed. All appointments and medications reviewed with patient and spouse. IV removed. Patients belongings packed and in tow. Patient left the unit in stable condition via wheelchair.  Sheliah Plane RN

## 2017-10-29 NOTE — Progress Notes (Signed)
   Subjective: 3 Days Post-Op Procedure(s) (LRB): IRRIGATION AND DRAINAGE/DEBRIDEMENT LEFT BELOW KNEE AMPUTATION STUMP (Left) Patient reports pain as mild.   Patient seen in rounds with Dr. Doran Durand. Patient is well, and has had no acute complaints or problems Patient is ready to go home today  Objective: Vital signs in last 24 hours: Temp:  [98.2 F (36.8 C)-99.4 F (37.4 C)] 98.4 F (36.9 C) (01/27 0425) Pulse Rate:  [69-77] 69 (01/27 0425) Resp:  [16-17] 17 (01/27 0425) BP: (125-153)/(52-78) 153/78 (01/27 0425) SpO2:  [92 %-100 %] 93 % (01/27 0425) Weight:  [114.1 kg (251 lb 8.7 oz)] 114.1 kg (251 lb 8.7 oz) (01/26 2041)  Intake/Output from previous day:  Intake/Output Summary (Last 24 hours) at 10/29/2017 0908 Last data filed at 10/29/2017 0830 Gross per 24 hour  Intake 960 ml  Output 2375 ml  Net -1415 ml    Intake/Output this shift: No intake/output data recorded.  Labs: Recent Labs    10/26/17 2002 10/27/17 0836 10/28/17 0524  HGB 12.8* 11.7* 11.4*   Recent Labs    10/27/17 0836 10/28/17 0524  WBC 8.9 8.1  RBC 3.78* 3.73*  HCT 34.8* 34.3*  PLT 144* 149*   Recent Labs    10/28/17 0524 10/29/17 0509  NA 136 138  K 5.1 4.8  CL 107 108  CO2 19* 22  BUN 32* 37*  CREATININE 2.22* 2.12*  GLUCOSE 195* 229*  CALCIUM 8.5* 8.5*   No results for input(s): LABPT, INR in the last 72 hours.  EXAM: General - Patient is Alert and Appropriate Extremity - No cellulitis present Compartment soft Sutures intact, JP Drain pulled without difficulty Incision - clean, dry Stump looks good  Assessment/Plan: 3 Days Post-Op Procedure(s) (LRB): IRRIGATION AND DRAINAGE/DEBRIDEMENT LEFT BELOW KNEE AMPUTATION STUMP (Left) Procedure(s) (LRB): IRRIGATION AND DRAINAGE/DEBRIDEMENT LEFT BELOW KNEE AMPUTATION STUMP (Left) Past Medical History:  Diagnosis Date  . Diabetes mellitus without complication ([place])   . High cholesterol   . Hypertension   . Sleep apnea    uses  cpap   Principal Problem:   Acute renal failure superimposed on stage 3 chronic kidney disease (HCC) Active Problems:   Acute abscess   Diabetes mellitus type 2 in obese (HCC)   Hypertension   High cholesterol   Sleep apnea   Abscess of left lower extremity  Estimated body mass index is 32.3 kg/m as calculated from the following:   Height as of this encounter: 6\' 2"  (1.88 m).   Weight as of this encounter: 114.1 kg (251 lb 8.7 oz).  Diet - Cardiac diet, Diabetic diet and Renal diet Follow up - in 2 weeks with Dr. Doran Durand Activity - up ad lib, hold on using the prosthetic leg for now. Disposition - Home Condition Upon Discharge - Stable D/C Meds - See DC Summary  Arlee Muslim, PA-C Orthopaedic Surgery 10/29/2017, 9:08 AM

## 2017-10-31 LAB — AEROBIC/ANAEROBIC CULTURE W GRAM STAIN (SURGICAL/DEEP WOUND)

## 2017-10-31 LAB — AEROBIC/ANAEROBIC CULTURE (SURGICAL/DEEP WOUND)

## 2017-11-06 ENCOUNTER — Ambulatory Visit: Payer: Medicare Other | Admitting: Infectious Diseases

## 2017-11-06 ENCOUNTER — Encounter: Payer: Self-pay | Admitting: Infectious Diseases

## 2017-11-06 DIAGNOSIS — L02416 Cutaneous abscess of left lower limb: Secondary | ICD-10-CM | POA: Diagnosis not present

## 2017-11-06 NOTE — Progress Notes (Signed)
Isaiah Mejia  06-12-1961 [id number]   HPI: Isaiah Mejia is a 57 y.o. male here for follow up on abscess of left lower extremity involving previous BKA (2015 d/t MRSA infection).   Isaiah Mejia was hospitalized recently from 1/22 - 1/27 2018. During this time Isaiah Mejia required I&D of abscess by Dr. Doran Durand on 10/26/2017. MRI revealed 5 x 11 cm fluid collection concerning for abscess w/o evidence of osteomyelitis. Isaiah Mejia was started empirically on vancomycin/zosyn when Isaiah Mejia arrived to the ED and through surgery. ID was consulted while inpatient for recommendation for antibiotic management after surgery. Isaiah Mejia was evaluated by Dr. Linus Salmons during this hospitalization and switched to empiric ceftaroline due to AKI while on V/Z. Isaiah Mejia has diabetes which is well controlled with last HgbA1C 5.7.   Wound Cx Staph Aureus (S-oxacillin, R-clindamycin, erythromycin) and Isaiah Mejia was discharged home on Keflex 500 mg TID x 14d. Isaiah Mejia is currently on day 11/14 today.   Isaiah Mejia reports feeling very well today aside from some pain in the lateral thigh that is new. Isaiah Mejia has been less active and has not been wearing his prosthesis since surgery as instructed. No fevers or chills. Isaiah Mejia has been taking his Keflex as instructed and is tolerating it well and without side effect. Isaiah Mejia will have follow up soon with Dr. Doran Durand soon to remove stitches. Isaiah Mejia reports no drainage from his incision sites.   Patient Active Problem List   Diagnosis Date Noted  . Abscess of left lower extremity   . Diabetes mellitus type 2 in obese ([place]) 10/26/2017  . Hypertension 10/26/2017  . High cholesterol 10/26/2017  . Sleep apnea 10/26/2017  . Acute renal failure superimposed on stage 3 chronic kidney disease ([place]) 10/26/2017    Patient's Medications  New Prescriptions   No medications on file  Previous Medications   AMLODIPINE (NORVASC) 10 MG TABLET    Take 10 mg by mouth daily.   ASPIRIN 325 MG TABLET    Take 325 mg by mouth daily.   ATORVASTATIN (LIPITOR) 20 MG  TABLET    Take 1 tablet (20 mg total) by mouth daily at 6 PM.   CEPHALEXIN (KEFLEX) 500 MG CAPSULE    Take 1 capsule (500 mg total) by mouth 3 (three) times daily.   DOCUSATE SODIUM (COLACE) 100 MG CAPSULE    Take 1 capsule (100 mg total) by mouth 2 (two) times daily. While taking narcotic pain medicine.   GABAPENTIN (NEURONTIN) 300 MG CAPSULE    Take 300 mg by mouth 3 (three) times daily.   GLIPIZIDE (GLUCOTROL XL) 10 MG 24 HR TABLET    Take 1 tablet by mouth daily.   MULTIPLE VITAMINS-MINERALS (MULTIVITAMIN WITH MINERALS) TABLET    Take 1 tablet by mouth daily.   OXYCODONE (OXY IR/ROXICODONE) 5 MG IMMEDIATE RELEASE TABLET    Take 1-2 tablets (5-10 mg total) by mouth every 4 (four) hours as needed for severe pain (5mg  for mild pain, 10mg  for severe pain).   SENNA (SENOKOT) 8.6 MG TABS TABLET    Take 2 tablets (17.2 mg total) by mouth 2 (two) times daily.   TRAMADOL (ULTRAM) 50 MG TABLET    Take 1 tablet (50 mg total) by mouth every 6 (six) hours as needed for moderate pain.   VITAMIN C (ASCORBIC ACID) 500 MG TABLET    Take 500 mg by mouth daily.  Modified Medications   No medications on file  Discontinued Medications   No medications on file    Review of Systems  Constitutional: Negative for chills and fever.  HENT: Negative for tinnitus.   Eyes: Negative for blurred vision and photophobia.  Respiratory: Negative for cough and sputum production.   Cardiovascular: Positive for leg swelling. Negative for chest pain.  Gastrointestinal: Negative for diarrhea, nausea and vomiting.  Genitourinary: Negative for dysuria.  Musculoskeletal: Positive for joint pain (left thigh pain ).  Skin: Negative for rash.  Neurological: Negative for weakness and headaches.     Past Medical History:  Diagnosis Date  . Diabetes mellitus without complication ([place])   . High cholesterol   . Hypertension   . Sleep apnea    uses cpap    Social History   Tobacco Use  . Smoking status: Former Research scientist (life sciences)  .  Smokeless tobacco: Never Used  Substance Use Topics  . Alcohol use: No  . Drug use: No    Family History  Problem Relation Age of Onset  . Diabetes Brother   . Heart disease Brother   . Heart disease Other      Allergies  Allergen Reactions  . Sulfa Antibiotics     Throat swelling     OBJECTIVE: Vitals:   11/06/17 1345  BP: (!) 145/73  Pulse: 65  Temp: 98.6 F (37 C)  TempSrc: Oral  Weight: 248 lb (112.5 kg)  Height: 6\' 2"  (1.88 m)   Body mass index is 31.84 kg/m.  Physical Exam  Constitutional: Isaiah Mejia is oriented to person, place, and time and well-developed, well-nourished, and in no distress.  HENT:  Mouth/Throat: Oropharynx is clear and moist. No oral lesions. Normal dentition. No dental caries.  Eyes: Pupils are equal, round, and reactive to light. No scleral icterus.  Cardiovascular: Normal rate and regular rhythm.  Pulmonary/Chest: Effort normal and breath sounds normal.  Abdominal: Soft.  Musculoskeletal: Isaiah Mejia exhibits edema (2+ edema left lateral thigh/knee).  Surgical incision on left BKA from recent I&D with intact sutures, no erythema or drainage.   Neurological: Isaiah Mejia is alert and oriented to person, place, and time.  Skin: Skin is warm and dry. No rash noted.  Psychiatric: Mood and affect normal.  Vitals reviewed.  ASSESSMENT & PLAN:  Problem List Items Addressed This Visit      Other   Abscess of left lower extremity    No signs of infection from lower extremity. MRI without concern for osteomyelitis. Will have him continue keflex 500 mg TID through 14 days total - Dose appropriate for current kidney function. Isaiah Mejia will follow up with Dr. Doran Durand soon. His stump stocking is very tight and may be contributing to some of his edema in the thigh.   Isaiah Mejia was MSSA nasal PCR (+) inpatient and was de-colonized from what Isaiah Mejia describes. We discussed MSSA vs MRSA during visit encounter today to help with his understanding of current infection.   Isaiah Mejia can follow up here  PRN.         Janene Madeira, MSN, [facility] for Infectious Disease [facility]  11/06/2017  2:51 PM

## 2017-11-06 NOTE — Patient Instructions (Signed)
Your leg looks great!   Continue taking your antibiotics until they are gone and you can follow up here as needed.   You had a staph infection in your leg but it was a "cousin" of MRSA. This means that we had more antibiotic options to treat you with.

## 2017-11-06 NOTE — Assessment & Plan Note (Addendum)
No signs of infection from lower extremity. MRI without concern for osteomyelitis. Will have him continue keflex 500 mg TID through 14 days total - Dose appropriate for current kidney function. He will follow up with Dr. Doran Durand soon. His stump stocking is very tight and may be contributing to some of his edema in the thigh.   He was MSSA nasal PCR (+) inpatient and was de-colonized from what he describes. We discussed MSSA vs MRSA during visit encounter today to help with his understanding of current infection.   He can follow up here PRN.

## 2017-11-08 ENCOUNTER — Other Ambulatory Visit: Payer: Self-pay | Admitting: Nephrology

## 2017-11-08 DIAGNOSIS — N183 Chronic kidney disease, stage 3 unspecified: Secondary | ICD-10-CM

## 2017-11-14 ENCOUNTER — Other Ambulatory Visit: Payer: Medicare Other

## 2018-11-06 DIAGNOSIS — Z0000 Encounter for general adult medical examination without abnormal findings: Secondary | ICD-10-CM | POA: Diagnosis not present

## 2018-11-06 DIAGNOSIS — D649 Anemia, unspecified: Secondary | ICD-10-CM | POA: Diagnosis not present

## 2018-11-06 DIAGNOSIS — Z89519 Acquired absence of unspecified leg below knee: Secondary | ICD-10-CM | POA: Diagnosis not present

## 2018-11-06 DIAGNOSIS — E875 Hyperkalemia: Secondary | ICD-10-CM | POA: Diagnosis not present

## 2018-11-06 DIAGNOSIS — J302 Other seasonal allergic rhinitis: Secondary | ICD-10-CM | POA: Diagnosis not present

## 2018-11-06 DIAGNOSIS — N183 Chronic kidney disease, stage 3 (moderate): Secondary | ICD-10-CM | POA: Diagnosis not present

## 2018-11-06 DIAGNOSIS — E1140 Type 2 diabetes mellitus with diabetic neuropathy, unspecified: Secondary | ICD-10-CM | POA: Diagnosis not present

## 2018-11-06 DIAGNOSIS — I10 Essential (primary) hypertension: Secondary | ICD-10-CM | POA: Diagnosis not present

## 2018-11-07 DIAGNOSIS — I129 Hypertensive chronic kidney disease with stage 1 through stage 4 chronic kidney disease, or unspecified chronic kidney disease: Secondary | ICD-10-CM | POA: Diagnosis not present

## 2018-11-07 DIAGNOSIS — N183 Chronic kidney disease, stage 3 (moderate): Secondary | ICD-10-CM | POA: Diagnosis not present

## 2018-11-07 DIAGNOSIS — R609 Edema, unspecified: Secondary | ICD-10-CM | POA: Diagnosis not present

## 2018-11-07 DIAGNOSIS — N184 Chronic kidney disease, stage 4 (severe): Secondary | ICD-10-CM | POA: Diagnosis not present

## 2018-11-09 DIAGNOSIS — E875 Hyperkalemia: Secondary | ICD-10-CM | POA: Diagnosis not present

## 2018-11-21 DIAGNOSIS — N183 Chronic kidney disease, stage 3 (moderate): Secondary | ICD-10-CM | POA: Diagnosis not present

## 2018-11-23 DIAGNOSIS — N184 Chronic kidney disease, stage 4 (severe): Secondary | ICD-10-CM | POA: Diagnosis not present

## 2018-11-23 DIAGNOSIS — I129 Hypertensive chronic kidney disease with stage 1 through stage 4 chronic kidney disease, or unspecified chronic kidney disease: Secondary | ICD-10-CM | POA: Diagnosis not present

## 2018-11-23 DIAGNOSIS — R609 Edema, unspecified: Secondary | ICD-10-CM | POA: Diagnosis not present

## 2018-12-11 IMAGING — MR MR [PERSON_NAME] LOW WO/W CM*L*
4 of 9 series · 19 of 40 positions shown · IV contrast (multihance)
Comparison: Left tibia and fibula x-rays from same day.

CLINICAL DATA: History of left BKA with new drainage from the stump
wound. Evaluate for osteomyelitis.

EXAM:
MRI OF LOWER LEFT EXTREMITY WITHOUT AND WITH CONTRAST
TECHNIQUE: Multiplanar, multisequence MR imaging of the left tibia and fibula
was performed both before and after administration of intravenous
contrast.
CONTRAST:  20mL MULTIHANCE GADOBENATE DIMEGLUMINE 529 MG/ML IV SOLN

[Series 3: T1 · axial · 5.0mm · 0.68mm/px · z∈[-234,+78]mm · 5 of 49 slices shown]
[im 1/49]
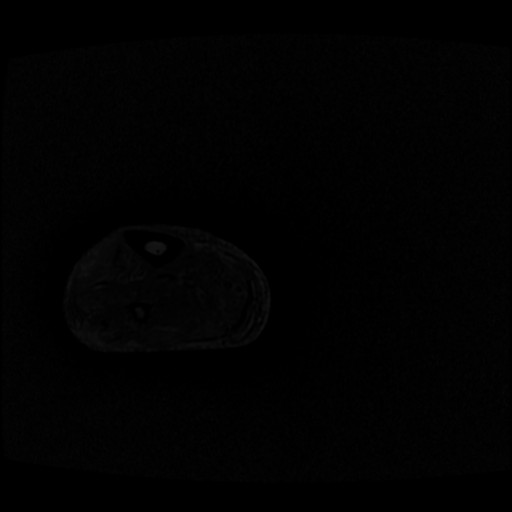
[im 13/49]
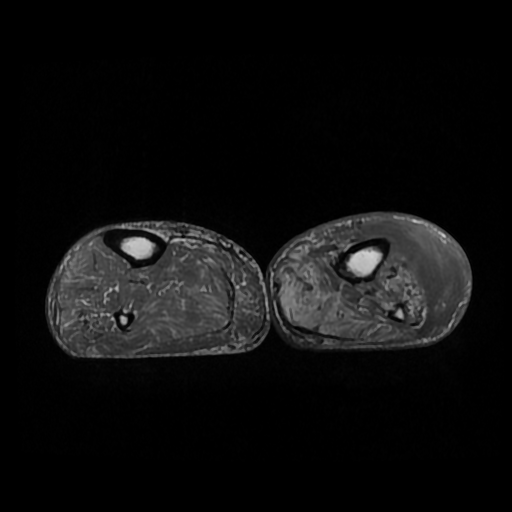
[im 25/49]
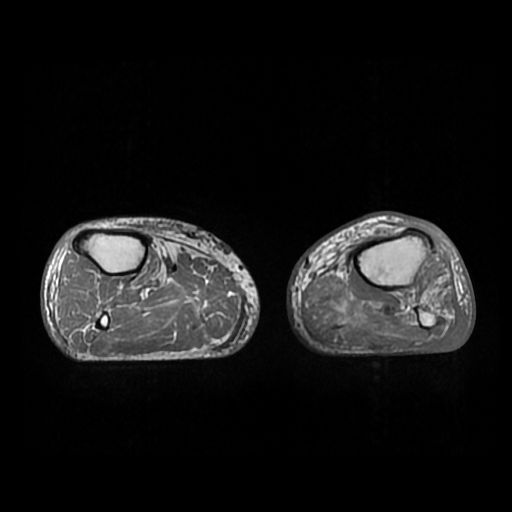
[im 37/49]
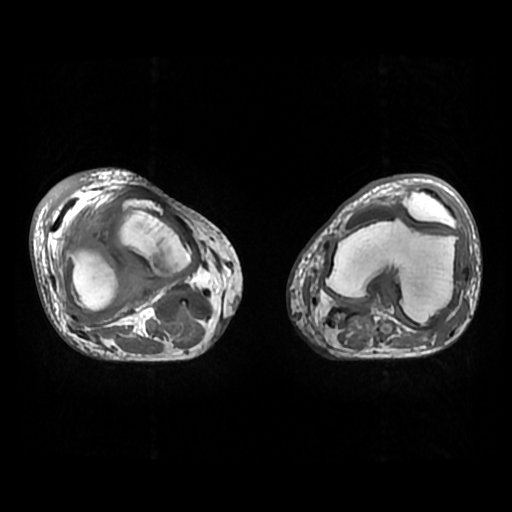
[im 49/49]
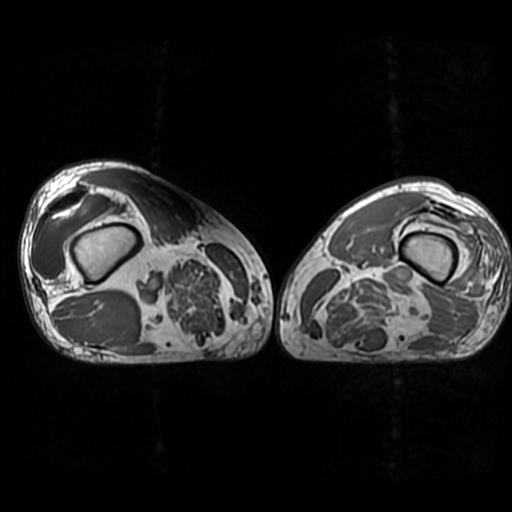

[Series 4: T2 fat-sat · axial · 5.0mm · 0.68mm/px · z∈[-234,+78]mm · 5 of 49 slices shown]
[im 1/49]
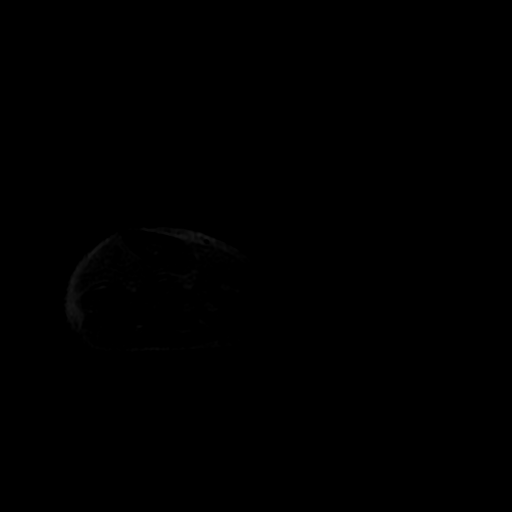
[im 13/49]
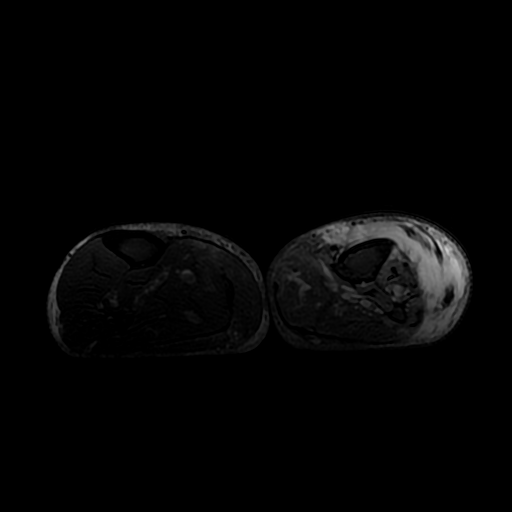
[im 25/49]
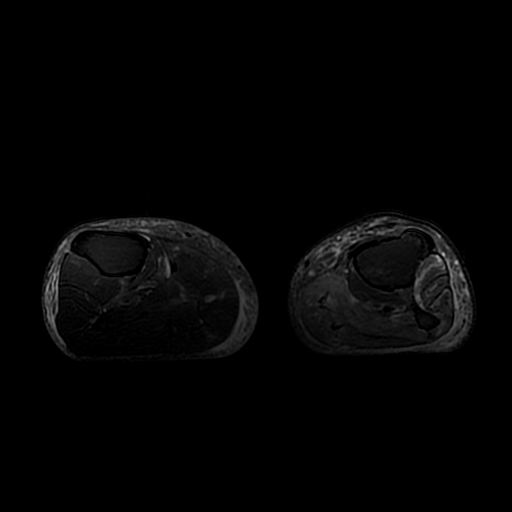
[im 37/49]
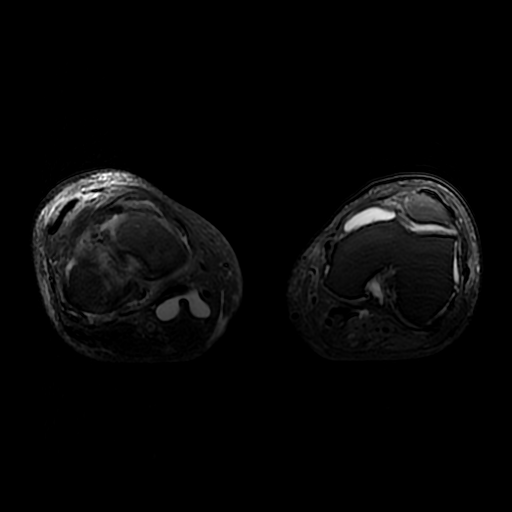
[im 49/49]
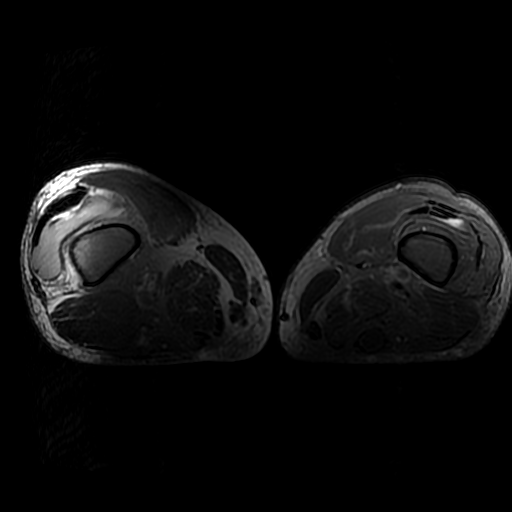

[Series 5: T1 fat-sat · axial · non-contrast · 5.0mm · 0.68mm/px · z∈[-234,+78]mm · 6 of 49 slices shown (1 of 2)]
[im 1/49]
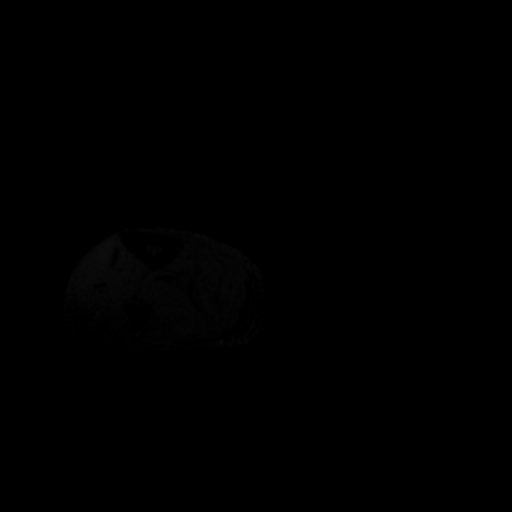
[im 10/49]
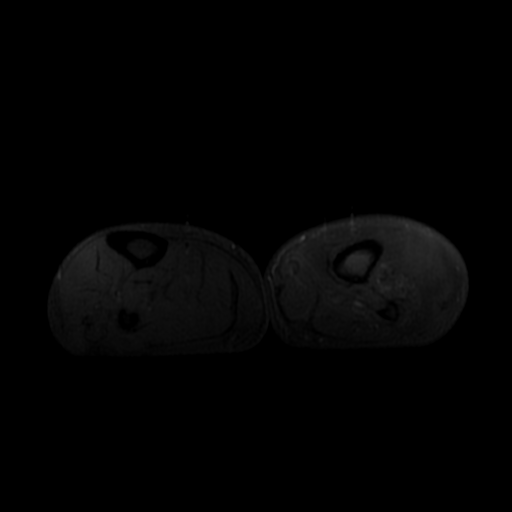
[im 20/49]
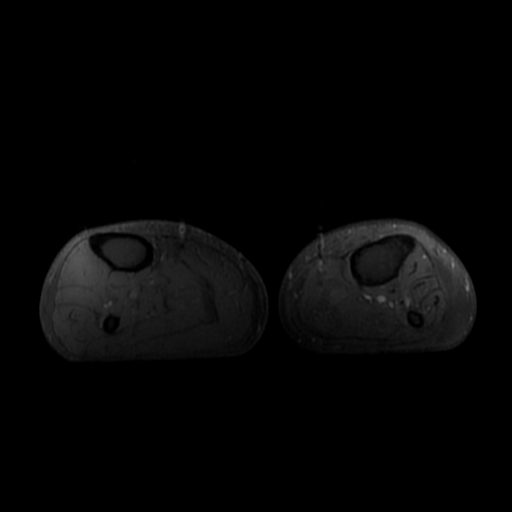
[im 29/49]
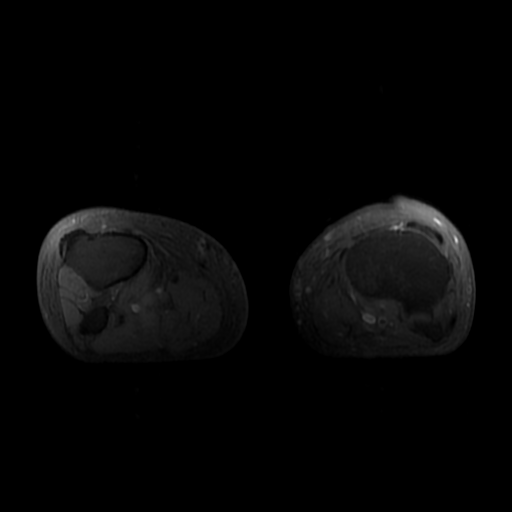
[im 39/49]
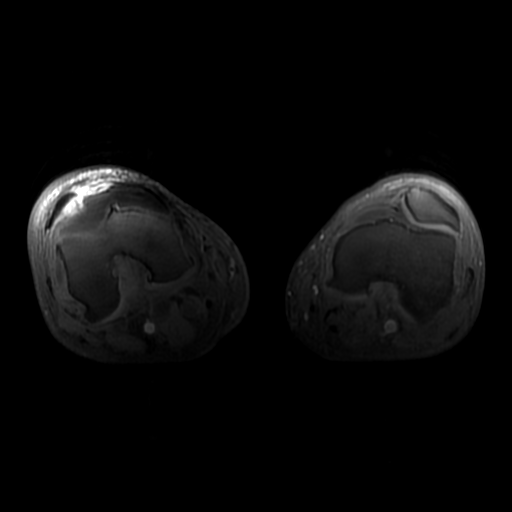
[im 49/49]
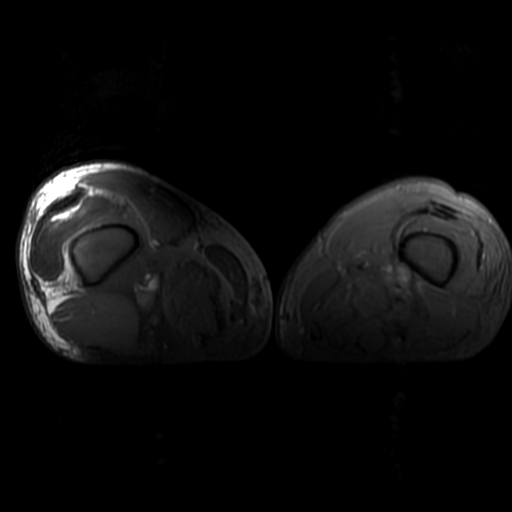

[Series 10: T1 fat-sat · coronal · 5.0mm · 0.84mm/px · 3 of 25 slices shown (2 of 2)]
[im 1/25]
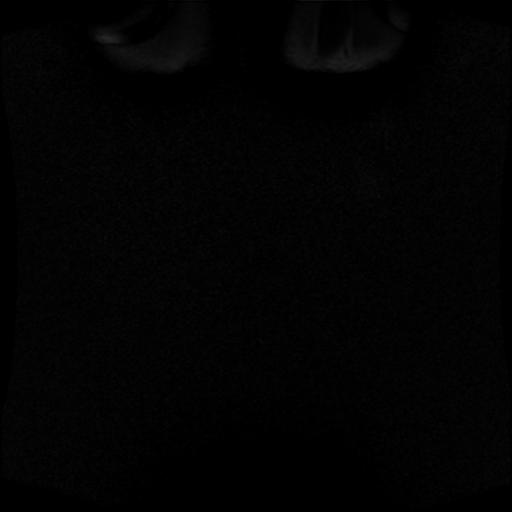
[im 13/25]
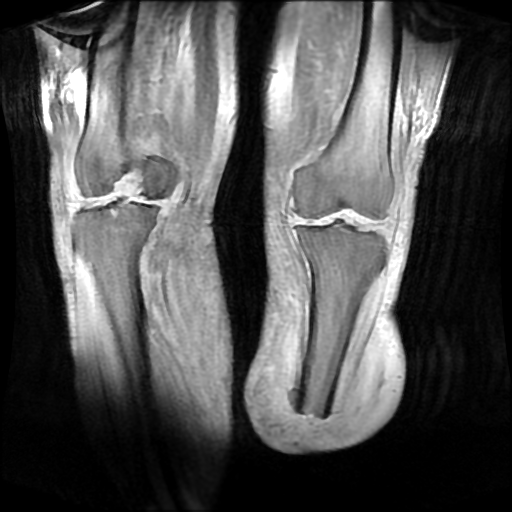
[im 25/25]
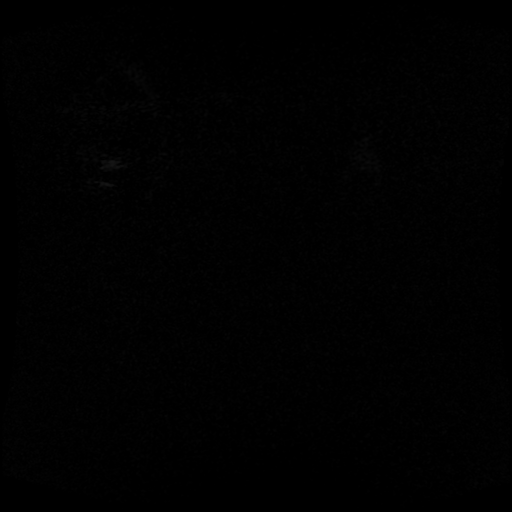

[19 of 40 positions shown; findings below may reference images not displayed]

FINDINGS: Bones/Joint/Cartilage

Prior left below-knee amputation. No marrow signal abnormality. No
fracture or dislocation. Normal alignment. Moderate right and small
left knee joint effusions. Moderate degenerative changes of the
right knee. Mild degenerative changes of the left knee. Moderate
right Baker cyst.

Ligaments

Knee ligaments are grossly intact. Tears of both medial menisci,
incompletely evaluated.

Muscles and Tendons
Mild fatty atrophy of the proximal left lower leg muscles.

Soft tissue
Prominent soft tissue edema and enhancement along the anterior and
lateral aspect of the BKA stump. A few foci of T1 and T2 hypointense
signal within the soft tissues likely represents air. There is an
ill-defined, irregular, rim enhancing fluid collection in the
lateral subcutaneous tissues of the distal stump which appears to
extend to the skin surface. This measures approximately 5.3 x 3.0 x
11.0 cm (AP by transverse by CC).
IMPRESSION: 1. Cellulitis of the distal left BKA stump with 5.3 x 3.0 x 11.0 cm
ill-defined, irregular, rim enhancing fluid collection in the
lateral subcutaneous tissues of the distal stump, concerning for
abscess.
2. No evidence of osteomyelitis.
3. Moderate right and small left knee joint effusions. Moderate
right and mild left knee osteoarthritis.
4. Degenerative tearing of both medial menisci, incompletely
evaluated.

## 2018-12-26 DIAGNOSIS — G4733 Obstructive sleep apnea (adult) (pediatric): Secondary | ICD-10-CM | POA: Diagnosis not present

## 2019-01-07 DIAGNOSIS — G4733 Obstructive sleep apnea (adult) (pediatric): Secondary | ICD-10-CM | POA: Diagnosis not present

## 2019-01-15 DIAGNOSIS — G4733 Obstructive sleep apnea (adult) (pediatric): Secondary | ICD-10-CM | POA: Diagnosis not present

## 2019-01-22 DIAGNOSIS — N183 Chronic kidney disease, stage 3 (moderate): Secondary | ICD-10-CM | POA: Diagnosis not present

## 2019-02-14 DIAGNOSIS — N184 Chronic kidney disease, stage 4 (severe): Secondary | ICD-10-CM | POA: Diagnosis not present

## 2019-02-14 DIAGNOSIS — I129 Hypertensive chronic kidney disease with stage 1 through stage 4 chronic kidney disease, or unspecified chronic kidney disease: Secondary | ICD-10-CM | POA: Diagnosis not present

## 2019-02-14 DIAGNOSIS — G4733 Obstructive sleep apnea (adult) (pediatric): Secondary | ICD-10-CM | POA: Diagnosis not present

## 2019-02-14 DIAGNOSIS — R609 Edema, unspecified: Secondary | ICD-10-CM | POA: Diagnosis not present

## 2019-02-28 DIAGNOSIS — N184 Chronic kidney disease, stage 4 (severe): Secondary | ICD-10-CM | POA: Diagnosis not present

## 2019-02-28 DIAGNOSIS — I129 Hypertensive chronic kidney disease with stage 1 through stage 4 chronic kidney disease, or unspecified chronic kidney disease: Secondary | ICD-10-CM | POA: Diagnosis not present

## 2019-03-06 DIAGNOSIS — N184 Chronic kidney disease, stage 4 (severe): Secondary | ICD-10-CM | POA: Diagnosis not present

## 2019-03-06 DIAGNOSIS — J209 Acute bronchitis, unspecified: Secondary | ICD-10-CM | POA: Diagnosis not present

## 2019-03-07 DIAGNOSIS — R609 Edema, unspecified: Secondary | ICD-10-CM | POA: Diagnosis not present

## 2019-03-07 DIAGNOSIS — I129 Hypertensive chronic kidney disease with stage 1 through stage 4 chronic kidney disease, or unspecified chronic kidney disease: Secondary | ICD-10-CM | POA: Diagnosis not present

## 2019-03-07 DIAGNOSIS — N184 Chronic kidney disease, stage 4 (severe): Secondary | ICD-10-CM | POA: Diagnosis not present

## 2019-03-17 DIAGNOSIS — G4733 Obstructive sleep apnea (adult) (pediatric): Secondary | ICD-10-CM | POA: Diagnosis not present

## 2019-03-22 DIAGNOSIS — N184 Chronic kidney disease, stage 4 (severe): Secondary | ICD-10-CM | POA: Diagnosis not present

## 2019-03-22 IMAGING — US US RENAL
1 series · 14 of 25 positions shown · non-contrast
Comparison: None.

CLINICAL DATA: Acute renal injury

EXAM:
RENAL / URINARY TRACT ULTRASOUND COMPLETE

[Series 1: us renal · 0.28mm/px · 14 of 31 slices shown]
[im 1/31]
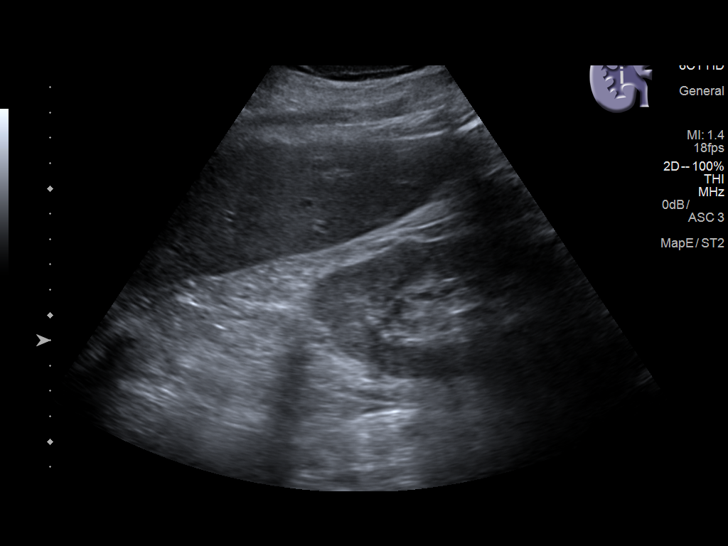
[im 3/31]
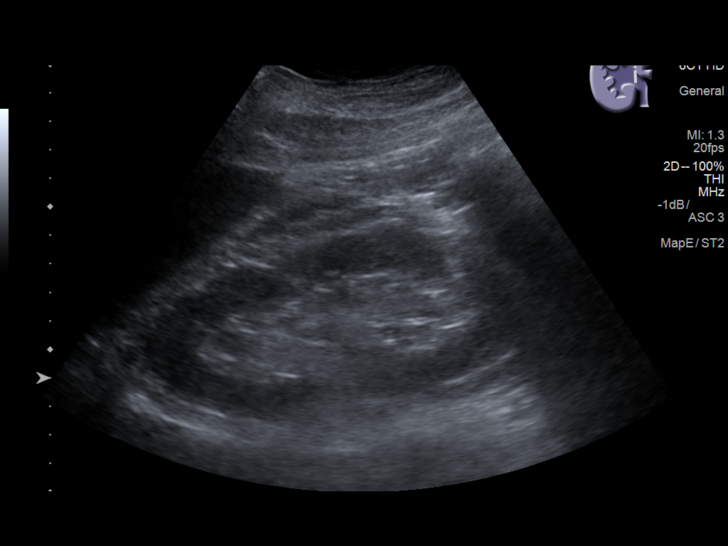
[im 6/31]
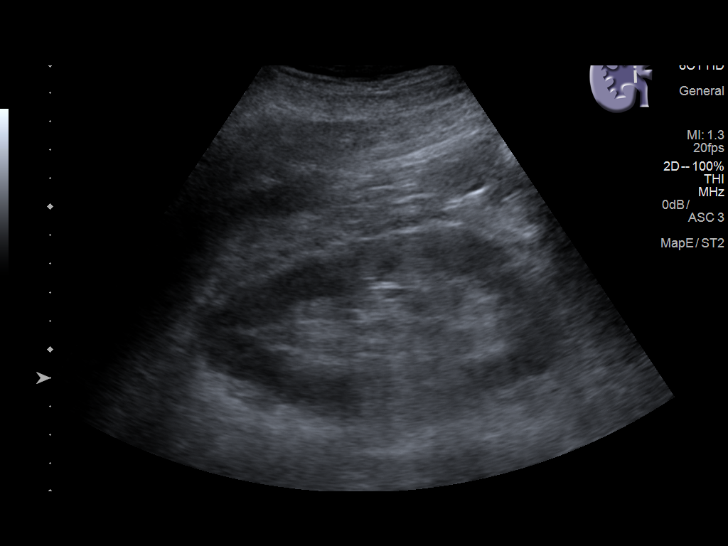
[im 8/31]
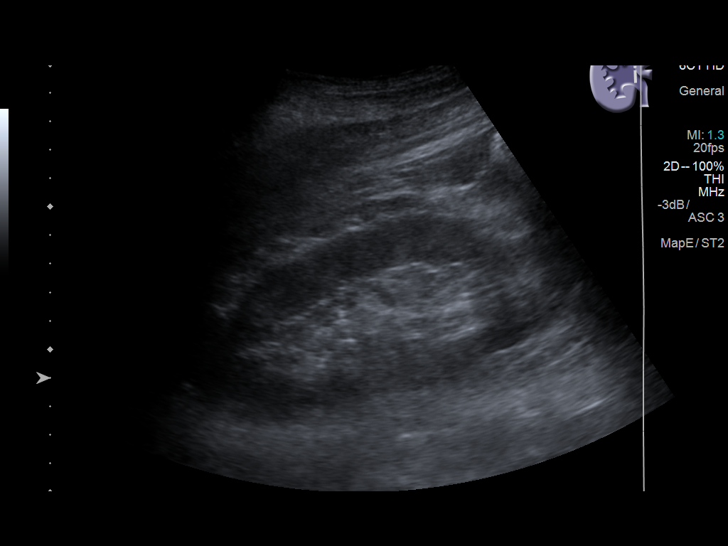
[im 11/31]
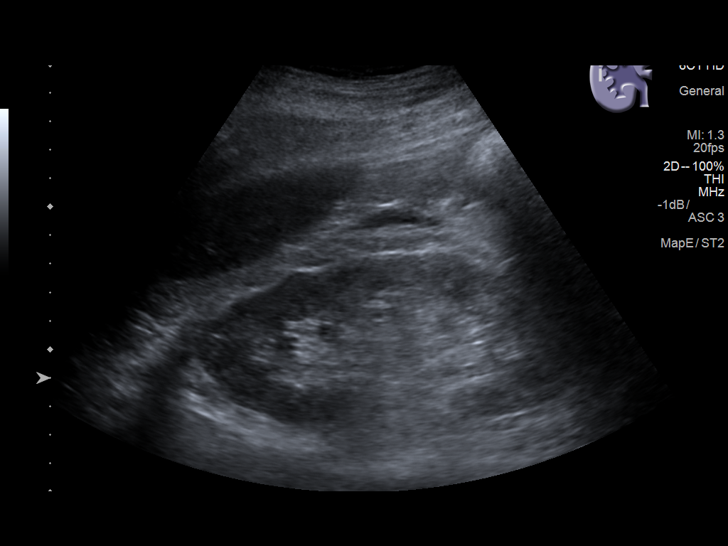
[im 12/31]
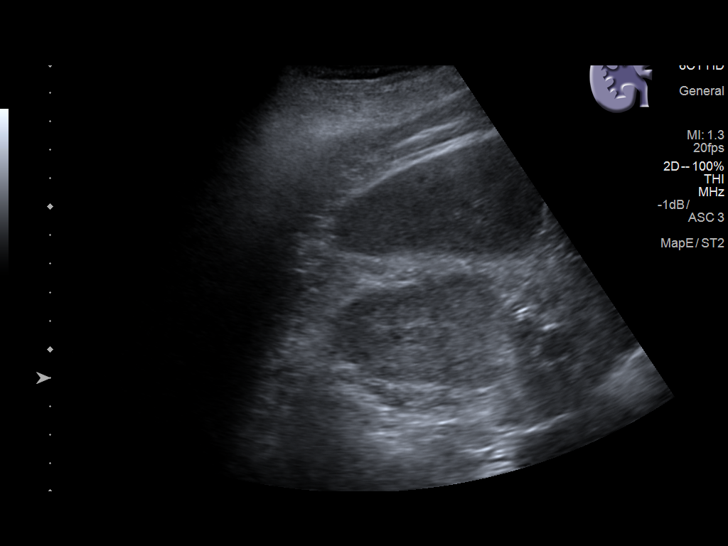
[im 14/31]
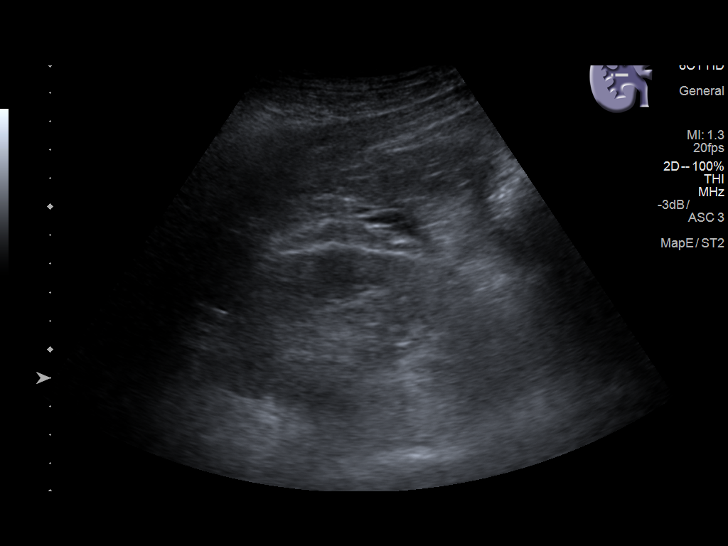
[im 17/31]
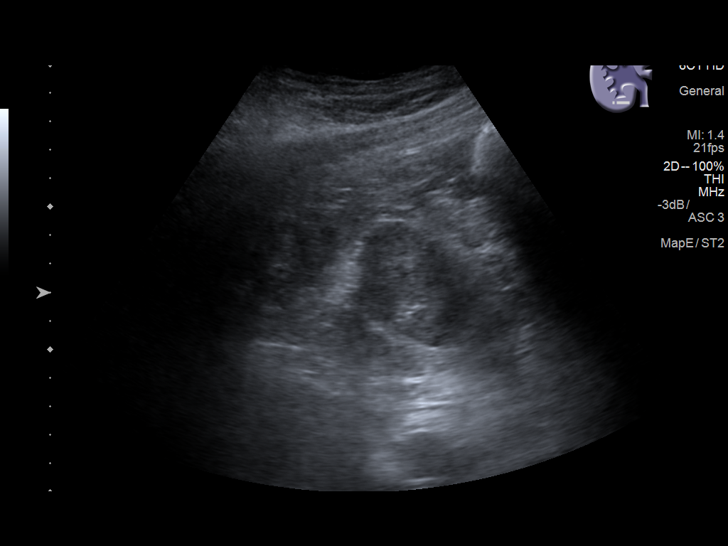
[im 19/31]
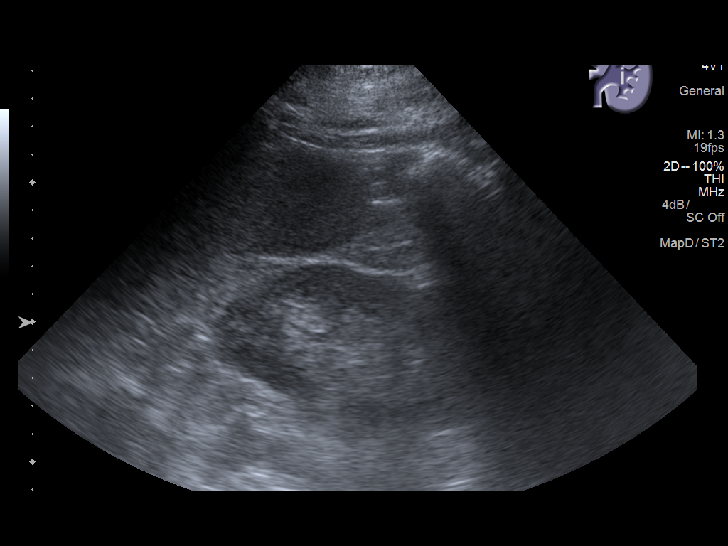
[im 21/31]
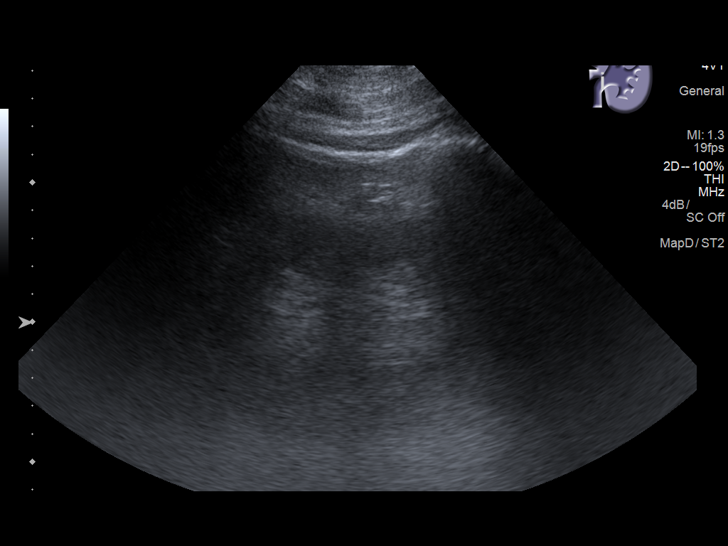
[im 23/31]
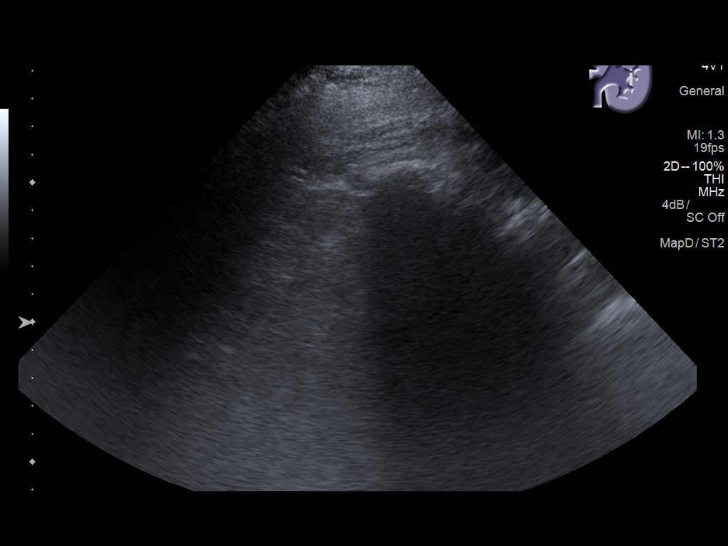
[im 26/31]
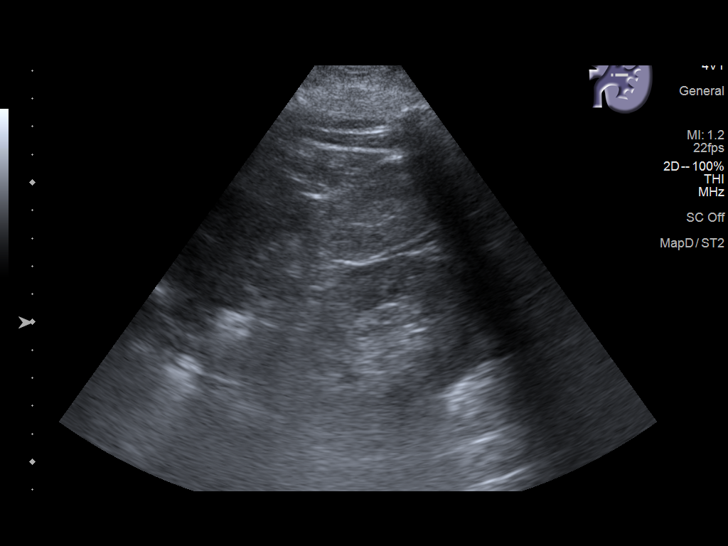
[im 28/31]
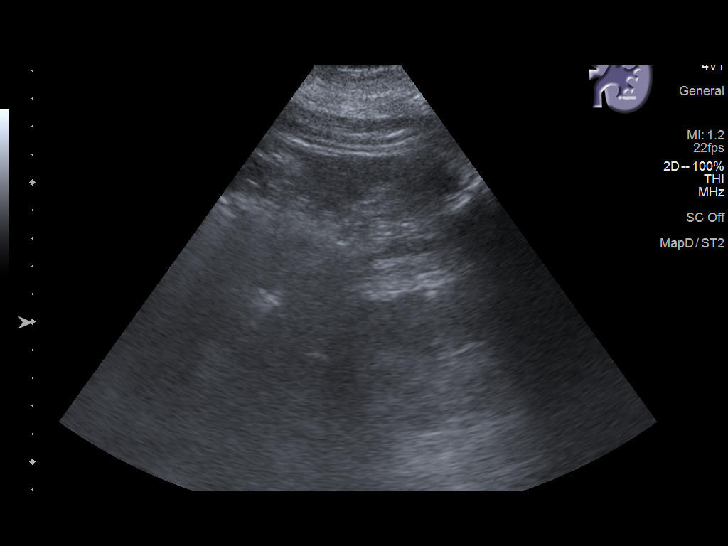
[im 31/31]
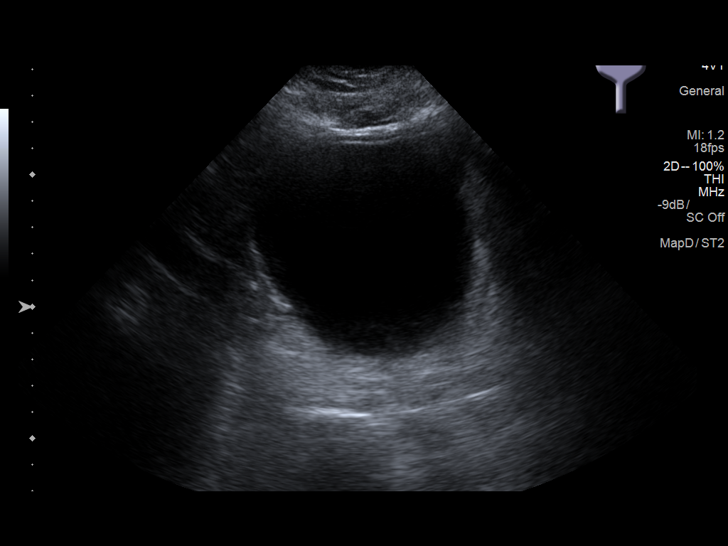

[14 of 25 positions shown; findings below may reference images not displayed]

FINDINGS: Right Kidney:

Length: 12.9 cm. There is a tiny amount of perinephric fluid. No
masses, stones, or hydronephrosis.

Left Kidney:

Length: 10.8 cm. The lower pole is not seen due to shadowing bowel
gas. No abnormalities identified.

Bladder:

Appears normal for degree of bladder distention.
IMPRESSION: 1. There is a tiny amount of right perinephric fluid which is
nonspecific. No hydronephrosis. Poor visualization of the lower pole
of the left kidney.

## 2019-04-10 DIAGNOSIS — G4733 Obstructive sleep apnea (adult) (pediatric): Secondary | ICD-10-CM | POA: Diagnosis not present

## 2019-04-16 DIAGNOSIS — G4733 Obstructive sleep apnea (adult) (pediatric): Secondary | ICD-10-CM | POA: Diagnosis not present

## 2019-05-15 DIAGNOSIS — Z89519 Acquired absence of unspecified leg below knee: Secondary | ICD-10-CM | POA: Diagnosis not present

## 2019-05-15 DIAGNOSIS — Z6834 Body mass index (BMI) 34.0-34.9, adult: Secondary | ICD-10-CM | POA: Diagnosis not present

## 2019-05-15 DIAGNOSIS — S81802A Unspecified open wound, left lower leg, initial encounter: Secondary | ICD-10-CM | POA: Diagnosis not present

## 2019-05-17 DIAGNOSIS — G4733 Obstructive sleep apnea (adult) (pediatric): Secondary | ICD-10-CM | POA: Diagnosis not present

## 2019-05-17 DIAGNOSIS — Z89512 Acquired absence of left leg below knee: Secondary | ICD-10-CM | POA: Diagnosis not present

## 2019-06-04 DIAGNOSIS — I10 Essential (primary) hypertension: Secondary | ICD-10-CM | POA: Diagnosis not present

## 2019-06-04 DIAGNOSIS — N183 Chronic kidney disease, stage 3 (moderate): Secondary | ICD-10-CM | POA: Diagnosis not present

## 2019-06-04 DIAGNOSIS — Z89519 Acquired absence of unspecified leg below knee: Secondary | ICD-10-CM | POA: Diagnosis not present

## 2019-06-04 DIAGNOSIS — M1990 Unspecified osteoarthritis, unspecified site: Secondary | ICD-10-CM | POA: Diagnosis not present

## 2019-06-04 DIAGNOSIS — E0840 Diabetes mellitus due to underlying condition with diabetic neuropathy, unspecified: Secondary | ICD-10-CM | POA: Diagnosis not present

## 2019-06-11 DIAGNOSIS — Z89519 Acquired absence of unspecified leg below knee: Secondary | ICD-10-CM | POA: Diagnosis not present

## 2019-06-11 DIAGNOSIS — E1140 Type 2 diabetes mellitus with diabetic neuropathy, unspecified: Secondary | ICD-10-CM | POA: Diagnosis not present

## 2019-06-17 DIAGNOSIS — G4733 Obstructive sleep apnea (adult) (pediatric): Secondary | ICD-10-CM | POA: Diagnosis not present

## 2019-07-17 DIAGNOSIS — G4733 Obstructive sleep apnea (adult) (pediatric): Secondary | ICD-10-CM | POA: Diagnosis not present

## 2019-07-19 DIAGNOSIS — Z89512 Acquired absence of left leg below knee: Secondary | ICD-10-CM | POA: Diagnosis not present

## 2019-08-16 DIAGNOSIS — Z89512 Acquired absence of left leg below knee: Secondary | ICD-10-CM | POA: Diagnosis not present

## 2019-08-16 DIAGNOSIS — M545 Low back pain: Secondary | ICD-10-CM | POA: Diagnosis not present

## 2019-08-17 DIAGNOSIS — G4733 Obstructive sleep apnea (adult) (pediatric): Secondary | ICD-10-CM | POA: Diagnosis not present

## 2019-08-24 DIAGNOSIS — Z20828 Contact with and (suspected) exposure to other viral communicable diseases: Secondary | ICD-10-CM | POA: Diagnosis not present

## 2019-08-24 DIAGNOSIS — T148XXA Other injury of unspecified body region, initial encounter: Secondary | ICD-10-CM | POA: Diagnosis not present

## 2024-06-03 DEATH — deceased
